# Patient Record
Sex: Male | Born: 1945 | Race: White | Hispanic: No | Smoking: Former smoker
Health system: Southern US, Community
[De-identification: ages and names within clinical notes are randomized; demographics above are authoritative.]

## PROBLEM LIST (undated history)

## (undated) DIAGNOSIS — R350 Frequency of micturition: Secondary | ICD-10-CM

## (undated) DIAGNOSIS — C679 Malignant neoplasm of bladder, unspecified: Secondary | ICD-10-CM

## (undated) DIAGNOSIS — R3915 Urgency of urination: Secondary | ICD-10-CM

## (undated) DIAGNOSIS — E785 Hyperlipidemia, unspecified: Secondary | ICD-10-CM

## (undated) DIAGNOSIS — K219 Gastro-esophageal reflux disease without esophagitis: Secondary | ICD-10-CM

## (undated) DIAGNOSIS — E119 Type 2 diabetes mellitus without complications: Secondary | ICD-10-CM

## (undated) DIAGNOSIS — R351 Nocturia: Secondary | ICD-10-CM

## (undated) DIAGNOSIS — R918 Other nonspecific abnormal finding of lung field: Secondary | ICD-10-CM

## (undated) DIAGNOSIS — I10 Essential (primary) hypertension: Secondary | ICD-10-CM

## (undated) HISTORY — PX: ILEO CONDUIT: SHX1778

## (undated) HISTORY — PX: TONSILLECTOMY: SUR1361

---

## 2004-11-26 ENCOUNTER — Ambulatory Visit: Payer: Self-pay | Admitting: Gastroenterology

## 2007-02-06 ENCOUNTER — Ambulatory Visit: Payer: Self-pay | Admitting: Internal Medicine

## 2010-12-01 ENCOUNTER — Emergency Department: Payer: Self-pay | Admitting: Emergency Medicine

## 2010-12-03 ENCOUNTER — Ambulatory Visit: Payer: Self-pay | Admitting: Internal Medicine

## 2010-12-05 LAB — PSA

## 2010-12-07 ENCOUNTER — Ambulatory Visit: Payer: Self-pay | Admitting: Internal Medicine

## 2010-12-19 ENCOUNTER — Ambulatory Visit: Payer: Self-pay | Admitting: Internal Medicine

## 2012-01-12 ENCOUNTER — Ambulatory Visit: Payer: Self-pay | Admitting: Internal Medicine

## 2012-04-10 ENCOUNTER — Ambulatory Visit: Payer: Self-pay | Admitting: Internal Medicine

## 2012-04-18 ENCOUNTER — Ambulatory Visit: Payer: Self-pay | Admitting: Internal Medicine

## 2012-05-19 ENCOUNTER — Ambulatory Visit: Payer: Self-pay | Admitting: Internal Medicine

## 2012-10-16 ENCOUNTER — Other Ambulatory Visit: Payer: Self-pay | Admitting: Urology

## 2012-10-22 ENCOUNTER — Encounter (HOSPITAL_BASED_OUTPATIENT_CLINIC_OR_DEPARTMENT_OTHER): Payer: Self-pay | Admitting: *Deleted

## 2012-10-22 NOTE — Progress Notes (Signed)
NPO AFTER MN. ARRIVES AT 0830. NEEDS ISTAT AND EKG (ORDERED PER MDA) AND CXR ORDERED BY DR [facility]. WILL TAKE PRILOSEC , NORVASC, PRAVASTATIN, AND METOPROLOL AM OF SURG W/ SIP OF WATER.

## 2012-10-24 ENCOUNTER — Encounter (HOSPITAL_BASED_OUTPATIENT_CLINIC_OR_DEPARTMENT_OTHER): Payer: Self-pay

## 2012-10-24 ENCOUNTER — Observation Stay (HOSPITAL_BASED_OUTPATIENT_CLINIC_OR_DEPARTMENT_OTHER)
Admission: RE | Admit: 2012-10-24 | Discharge: 2012-10-25 | Disposition: A | Discharge status: Home / Self Care | Payer: 59 | Source: Ambulatory Visit | Attending: Urology | Admitting: Urology

## 2012-10-24 ENCOUNTER — Ambulatory Visit (HOSPITAL_BASED_OUTPATIENT_CLINIC_OR_DEPARTMENT_OTHER): Payer: 59 | Admitting: Anesthesiology

## 2012-10-24 ENCOUNTER — Ambulatory Visit (HOSPITAL_COMMUNITY): Payer: 59

## 2012-10-24 ENCOUNTER — Encounter (HOSPITAL_BASED_OUTPATIENT_CLINIC_OR_DEPARTMENT_OTHER): Payer: Self-pay | Admitting: Anesthesiology

## 2012-10-24 ENCOUNTER — Encounter (HOSPITAL_COMMUNITY)
Admission: RE | Disposition: A | Discharge status: Home / Self Care | Payer: Self-pay | Source: Ambulatory Visit | Attending: Urology

## 2012-10-24 ENCOUNTER — Inpatient Hospital Stay (HOSPITAL_COMMUNITY): Payer: 59

## 2012-10-24 DIAGNOSIS — K219 Gastro-esophageal reflux disease without esophagitis: Secondary | ICD-10-CM | POA: Insufficient documentation

## 2012-10-24 DIAGNOSIS — E119 Type 2 diabetes mellitus without complications: Secondary | ICD-10-CM | POA: Insufficient documentation

## 2012-10-24 DIAGNOSIS — I10 Essential (primary) hypertension: Secondary | ICD-10-CM | POA: Insufficient documentation

## 2012-10-24 DIAGNOSIS — R918 Other nonspecific abnormal finding of lung field: Secondary | ICD-10-CM | POA: Insufficient documentation

## 2012-10-24 DIAGNOSIS — F17200 Nicotine dependence, unspecified, uncomplicated: Secondary | ICD-10-CM | POA: Insufficient documentation

## 2012-10-24 DIAGNOSIS — E785 Hyperlipidemia, unspecified: Secondary | ICD-10-CM | POA: Insufficient documentation

## 2012-10-24 DIAGNOSIS — C674 Malignant neoplasm of posterior wall of bladder: Principal | ICD-10-CM | POA: Insufficient documentation

## 2012-10-24 HISTORY — DX: Frequency of micturition: R35.0

## 2012-10-24 HISTORY — DX: Nocturia: R35.1

## 2012-10-24 HISTORY — PX: CYSTOSCOPY WITH URETEROSCOPY: SHX5123

## 2012-10-24 HISTORY — PX: TRANSURETHRAL RESECTION OF BLADDER TUMOR: SHX2575

## 2012-10-24 HISTORY — DX: Malignant neoplasm of bladder, unspecified: C67.9

## 2012-10-24 HISTORY — DX: Urgency of urination: R39.15

## 2012-10-24 HISTORY — DX: Gastro-esophageal reflux disease without esophagitis: K21.9

## 2012-10-24 HISTORY — DX: Essential (primary) hypertension: I10

## 2012-10-24 HISTORY — DX: Hyperlipidemia, unspecified: E78.5

## 2012-10-24 HISTORY — DX: Type 2 diabetes mellitus without complications: E11.9

## 2012-10-24 LAB — CBC
MCHC: 34.8 g/dL (ref 30.0–36.0)
Platelets: 131 10*3/uL — ABNORMAL LOW (ref 150–400)
RDW: 12.7 % (ref 11.5–15.5)
WBC: 5.2 10*3/uL (ref 4.0–10.5)

## 2012-10-24 LAB — GLUCOSE, CAPILLARY
Glucose-Capillary: 186 mg/dL — ABNORMAL HIGH (ref 70–99)
Glucose-Capillary: 333 mg/dL — ABNORMAL HIGH (ref 70–99)

## 2012-10-24 LAB — BASIC METABOLIC PANEL
Chloride: 99 mEq/L (ref 96–112)
GFR calc Af Amer: >90 mL/min (ref >90)
GFR calc non Af Amer: >90 mL/min (ref >90)
Potassium: 4.6 mEq/L (ref 3.5–5.1)
Sodium: 138 mEq/L (ref 135–145)

## 2012-10-24 LAB — POCT I-STAT 4, (NA,K, GLUC, HGB,HCT)
HCT: 45 % (ref 39.0–52.0)
Hemoglobin: 15.3 g/dL (ref 13.0–17.0)
Sodium: 142 mEq/L (ref 135–145)

## 2012-10-24 SURGERY — TURBT (TRANSURETHRAL RESECTION OF BLADDER TUMOR)
Anesthesia: General | Site: Ureter | Wound class: Clean Contaminated

## 2012-10-24 MED ORDER — SUCCINYLCHOLINE CHLORIDE 20 MG/ML IJ SOLN
INTRAMUSCULAR | Status: DC | PRN
  Administered 2012-10-24: 80 mg via INTRAVENOUS
  Administered 2012-10-24: 40 mg via INTRAVENOUS
  Administered 2012-10-24: 80 mg via INTRAVENOUS

## 2012-10-24 MED ORDER — LIDOCAINE HCL (CARDIAC) 20 MG/ML IV SOLN
INTRAVENOUS | Status: DC | PRN
  Administered 2012-10-24: 100 mg via INTRAVENOUS

## 2012-10-24 MED ORDER — HYDROCHLOROTHIAZIDE 12.5 MG PO CAPS
12.5000 mg | ORAL_CAPSULE | Freq: Every day | ORAL | Status: DC
  Administered 2012-10-24 – 2012-10-25 (×2): 12.5 mg via ORAL
  Filled 2012-10-24 (×2): qty 1

## 2012-10-24 MED ORDER — LACTATED RINGERS IV SOLN
INTRAVENOUS | Status: DC
  Filled 2012-10-24: qty 1000

## 2012-10-24 MED ORDER — TAMSULOSIN HCL 0.4 MG PO CAPS
0.4000 mg | ORAL_CAPSULE | Freq: Every day | ORAL | Status: DC
  Administered 2012-10-24 – 2012-10-25 (×2): 0.4 mg via ORAL
  Filled 2012-10-24 (×3): qty 1

## 2012-10-24 MED ORDER — AMLODIPINE BESYLATE 5 MG PO TABS
5.0000 mg | ORAL_TABLET | Freq: Every morning | ORAL | Status: DC
  Filled 2012-10-24 (×2): qty 1

## 2012-10-24 MED ORDER — MIDAZOLAM HCL 5 MG/5ML IJ SOLN
INTRAMUSCULAR | Status: DC | PRN
  Administered 2012-10-24: 2 mg via INTRAVENOUS

## 2012-10-24 MED ORDER — METOPROLOL SUCCINATE ER 100 MG PO TB24
200.0000 mg | ORAL_TABLET | Freq: Every morning | ORAL | Status: DC
  Administered 2012-10-25: 200 mg via ORAL
  Filled 2012-10-24: qty 2

## 2012-10-24 MED ORDER — ONDANSETRON HCL 4 MG/2ML IJ SOLN
INTRAMUSCULAR | Status: DC | PRN
  Administered 2012-10-24: 4 mg via INTRAVENOUS

## 2012-10-24 MED ORDER — ONDANSETRON HCL 4 MG/2ML IJ SOLN
4.0000 mg | INTRAMUSCULAR | Status: DC | PRN
  Filled 2012-10-24: qty 2

## 2012-10-24 MED ORDER — PANTOPRAZOLE SODIUM 40 MG PO TBEC
40.0000 mg | DELAYED_RELEASE_TABLET | Freq: Every day | ORAL | Status: DC
Start: 2012-10-24 — End: 2012-10-25
  Administered 2012-10-24 – 2012-10-25 (×2): 40 mg via ORAL
  Filled 2012-10-24 (×3): qty 1

## 2012-10-24 MED ORDER — SODIUM CHLORIDE 0.9 % IR SOLN
3000.0000 mL | Status: DC
  Administered 2012-10-24 (×2): 3000 mL

## 2012-10-24 MED ORDER — GLIMEPIRIDE 1 MG PO TABS
1.0000 mg | ORAL_TABLET | Freq: Two times a day (BID) | ORAL | Status: DC
  Filled 2012-10-24 (×3): qty 1

## 2012-10-24 MED ORDER — OXYBUTYNIN CHLORIDE 5 MG PO TABS
5.0000 mg | ORAL_TABLET | Freq: Three times a day (TID) | ORAL | Status: DC | PRN
  Administered 2012-10-25: 5 mg via ORAL
  Filled 2012-10-24: qty 1

## 2012-10-24 MED ORDER — LACTATED RINGERS IV SOLN
INTRAVENOUS | Status: DC
  Administered 2012-10-24: 18:00:00 via INTRAVENOUS

## 2012-10-24 MED ORDER — LISINOPRIL-HYDROCHLOROTHIAZIDE 20-12.5 MG PO TABS: 1.0000 | ORAL_TABLET | Freq: Every morning | ORAL | Status: DC

## 2012-10-24 MED ORDER — DEXTROSE 5 % IV SOLN
500.0000 mg | INTRAVENOUS | Status: DC
  Administered 2012-10-24 – 2012-10-25 (×2): 500 mg via INTRAVENOUS
  Filled 2012-10-24 (×3): qty 12.5

## 2012-10-24 MED ORDER — LACTATED RINGERS IV SOLN
INTRAVENOUS | Status: DC
  Administered 2012-10-24 (×2): via INTRAVENOUS
  Filled 2012-10-24: qty 1000

## 2012-10-24 MED ORDER — GLIMEPIRIDE 1 MG PO TABS
1.0000 mg | ORAL_TABLET | Freq: Two times a day (BID) | ORAL | Status: DC
  Administered 2012-10-24 – 2012-10-25 (×2): 1 mg via ORAL
  Filled 2012-10-24 (×4): qty 1

## 2012-10-24 MED ORDER — METOPROLOL SUCCINATE ER 100 MG PO TB24
200.0000 mg | ORAL_TABLET | Freq: Every morning | ORAL | Status: DC
  Filled 2012-10-24 (×2): qty 2

## 2012-10-24 MED ORDER — INSULIN ASPART 100 UNIT/ML ~~LOC~~ SOLN
0.0000 [IU] | Freq: Three times a day (TID) | SUBCUTANEOUS | Status: DC
  Administered 2012-10-24: 11 [IU] via SUBCUTANEOUS
  Administered 2012-10-25 (×2): 8 [IU] via SUBCUTANEOUS

## 2012-10-24 MED ORDER — LISINOPRIL 20 MG PO TABS
20.0000 mg | ORAL_TABLET | Freq: Every day | ORAL | Status: DC
  Administered 2012-10-24 – 2012-10-25 (×2): 20 mg via ORAL
  Filled 2012-10-24 (×2): qty 1

## 2012-10-24 MED ORDER — HYDROMORPHONE HCL PF 1 MG/ML IJ SOLN
0.5000 mg | INTRAMUSCULAR | Status: DC | PRN
  Filled 2012-10-24: qty 1

## 2012-10-24 MED ORDER — GENTAMICIN IN SALINE 1.6-0.9 MG/ML-% IV SOLN
80.0000 mg | INTRAVENOUS | Status: DC
  Filled 2012-10-24: qty 50

## 2012-10-24 MED ORDER — INSULIN ASPART 100 UNIT/ML ~~LOC~~ SOLN: 0.0000 [IU] | Freq: Three times a day (TID) | SUBCUTANEOUS | Status: DC

## 2012-10-24 MED ORDER — ASPIRIN EC 325 MG PO TBEC
325.0000 mg | DELAYED_RELEASE_TABLET | Freq: Every morning | ORAL | Status: DC
  Administered 2012-10-24: 325 mg via ORAL
  Filled 2012-10-24 (×3): qty 1

## 2012-10-24 MED ORDER — FENTANYL CITRATE 0.05 MG/ML IJ SOLN
INTRAMUSCULAR | Status: DC | PRN
  Administered 2012-10-24: 75 ug via INTRAVENOUS
  Administered 2012-10-24: 50 ug via INTRAVENOUS
  Administered 2012-10-24: 100 ug via INTRAVENOUS
  Administered 2012-10-24 (×2): 50 ug via INTRAVENOUS
  Administered 2012-10-24: 25 ug via INTRAVENOUS
  Administered 2012-10-24: 50 ug via INTRAVENOUS

## 2012-10-24 MED ORDER — FENTANYL CITRATE 0.05 MG/ML IJ SOLN
25.0000 ug | INTRAMUSCULAR | Status: DC | PRN
  Filled 2012-10-24: qty 1

## 2012-10-24 MED ORDER — KETOROLAC TROMETHAMINE 30 MG/ML IJ SOLN
INTRAMUSCULAR | Status: DC | PRN
  Administered 2012-10-24: 15 mg via INTRAVENOUS

## 2012-10-24 MED ORDER — OXYCODONE-ACETAMINOPHEN 5-325 MG PO TABS
1.0000 | ORAL_TABLET | ORAL | Status: DC | PRN
  Administered 2012-10-24 – 2012-10-25 (×3): 1 via ORAL
  Filled 2012-10-24 (×3): qty 1
  Filled 2012-10-24: qty 2

## 2012-10-24 MED ORDER — PROPOFOL 10 MG/ML IV BOLUS
INTRAVENOUS | Status: DC | PRN
  Administered 2012-10-24: 300 mg via INTRAVENOUS

## 2012-10-24 MED ORDER — IOHEXOL 300 MG/ML  SOLN
100.0000 mL | Freq: Once | INTRAMUSCULAR | Status: AC | PRN
  Administered 2012-10-24: 100 mL via INTRAVENOUS

## 2012-10-24 MED ORDER — AMLODIPINE BESYLATE 5 MG PO TABS
5.0000 mg | ORAL_TABLET | Freq: Every day | ORAL | Status: DC
  Administered 2012-10-25: 5 mg via ORAL
  Filled 2012-10-24: qty 1

## 2012-10-24 MED ORDER — SENNA 8.6 MG PO TABS
1.0000 | ORAL_TABLET | Freq: Two times a day (BID) | ORAL | Status: DC
  Administered 2012-10-24 – 2012-10-25 (×3): 8.6 mg via ORAL
  Filled 2012-10-24 (×4): qty 1

## 2012-10-24 MED ORDER — DOCUSATE SODIUM 100 MG PO CAPS
100.0000 mg | ORAL_CAPSULE | Freq: Two times a day (BID) | ORAL | Status: DC
  Administered 2012-10-24 – 2012-10-25 (×3): 100 mg via ORAL
  Filled 2012-10-24 (×5): qty 1

## 2012-10-24 MED ORDER — SIMVASTATIN 20 MG PO TABS
20.0000 mg | ORAL_TABLET | Freq: Every day | ORAL | Status: DC
Start: 2012-10-24 — End: 2012-10-25
  Administered 2012-10-24: 20 mg via ORAL
  Filled 2012-10-24 (×3): qty 1

## 2012-10-24 MED ORDER — DEXAMETHASONE SODIUM PHOSPHATE 4 MG/ML IJ SOLN
INTRAMUSCULAR | Status: DC | PRN
  Administered 2012-10-24: 4 mg via INTRAVENOUS

## 2012-10-24 SURGICAL SUPPLY — 35 items
BAG URINE DRAINAGE (UROLOGICAL SUPPLIES) ×4 IMPLANT
BAG URINE LEG 19OZ MD ST LTX (BAG) IMPLANT
BAG URINE LEG 500ML (DRAIN) ×4 IMPLANT
BAG URO CATCHER STRL LF (DRAPE) ×4 IMPLANT
BASKET LASER NITINOL 1.9FR (BASKET) IMPLANT
BASKET ZERO TIP NITINOL 2.4FR (BASKET) IMPLANT
CATH FOLEY 2WAY  3CC  8FR (CATHETERS)
CATH FOLEY 2WAY 3CC 8FR (CATHETERS) IMPLANT
CATH FOLEY 2WAY SLVR  5CC 22FR (CATHETERS)
CATH FOLEY 2WAY SLVR 30CC 20FR (CATHETERS) IMPLANT
CATH FOLEY 2WAY SLVR 30CC 24FR (CATHETERS) ×4 IMPLANT
CATH FOLEY 2WAY SLVR 5CC 22FR (CATHETERS) IMPLANT
CATH INTERMIT  6FR 70CM (CATHETERS) IMPLANT
CLOTH BEACON ORANGE TIMEOUT ST (SAFETY) ×4 IMPLANT
DRAPE CAMERA CLOSED 9X96 (DRAPES) ×4 IMPLANT
ELECT LOOP MED HF 24F 12D CBL (CLIP) ×4 IMPLANT
ELECT REM PT RETURN 9FT ADLT (ELECTROSURGICAL) ×4
ELECTRODE REM PT RTRN 9FT ADLT (ELECTROSURGICAL) ×3 IMPLANT
EVACUATOR MICROVAS BLADDER (UROLOGICAL SUPPLIES) IMPLANT
GLOVE BIO SURGEON STRL SZ7 (GLOVE) ×4 IMPLANT
GLOVE BIO SURGEON STRL SZ7.5 (GLOVE) ×4 IMPLANT
GLOVE INDICATOR 6.5 STRL GRN (GLOVE) ×8 IMPLANT
GOWN PREVENTION PLUS XLARGE (GOWN DISPOSABLE) IMPLANT
GOWN STRL NON-REIN LRG LVL3 (GOWN DISPOSABLE) ×8 IMPLANT
GUIDEWIRE ANG ZIPWIRE 038X150 (WIRE) ×4 IMPLANT
GUIDEWIRE STR DUAL SENSOR (WIRE) ×4 IMPLANT
IV NS IRRIG 3000ML ARTHROMATIC (IV SOLUTION) ×24 IMPLANT
KIT ASPIRATION TUBING (SET/KITS/TRAYS/PACK) IMPLANT
LOOP CUTTING 24FR OLYMPUS (CUTTING LOOP) IMPLANT
LOOP CUTTING 26FR OLYMPUS (CUTTING LOOP) IMPLANT
PACK CYSTOSCOPY (CUSTOM PROCEDURE TRAY) ×4 IMPLANT
SET ASPIRATION TUBING (TUBING) IMPLANT
SYRINGE 10CC LL (SYRINGE) ×4 IMPLANT
SYRINGE IRR TOOMEY STRL 70CC (SYRINGE) ×4 IMPLANT
TUBE FEEDING 8FR 16IN STR KANG (MISCELLANEOUS) IMPLANT

## 2012-10-24 NOTE — Progress Notes (Signed)
Dr. Berneice Heinrich in and taked w/ pt.

## 2012-10-24 NOTE — Anesthesia Postprocedure Evaluation (Signed)
  Anesthesia Post-op Note  Patient: Mario Reese  Procedure(s) Performed: Procedure(s) (LRB): TRANSURETHRAL RESECTION OF BLADDER TUMOR (TURBT) (N/A) CYSTOSCOPY WITH RETROGRADE PYELOGRAM/URETERAL STENT PLACEMENT (Bilateral)  Patient Location: PACU  Anesthesia Type: General  Level of Consciousness: awake and alert   Airway and Oxygen Therapy: Patient Spontanous Breathing  Post-op Pain: mild  Post-op Assessment: Post-op Vital signs reviewed, Patient's Cardiovascular Status Stable, Respiratory Function Stable, Patent Airway and No signs of Nausea or vomiting  Post-op Vital Signs: stable  Complications: No apparent anesthesia complications

## 2012-10-24 NOTE — Progress Notes (Signed)
Patient had an HS blood sugar of 344, no HS coverage ordered. MD notified. Will continue to monitor. Leeroy Cha

## 2012-10-24 NOTE — Anesthesia Procedure Notes (Signed)
Procedure Name: LMA Insertion Date/Time: 10/24/2012 10:05 AM Performed by: Norva Pavlov Pre-anesthesia Checklist: Patient identified, Emergency Drugs available, Suction available and Patient being monitored Patient Re-evaluated:Patient Re-evaluated prior to inductionOxygen Delivery Method: Circle System Utilized Preoxygenation: Pre-oxygenation with 100% oxygen Intubation Type: IV induction Ventilation: Mask ventilation without difficulty LMA: LMA inserted LMA Size: 5.0 Number of attempts: 1 Airway Equipment and Method: bite block Placement Confirmation: positive ETCO2 Tube secured with: Tape Dental Injury: Teeth and Oropharynx as per pre-operative assessment

## 2012-10-24 NOTE — H&P (Signed)
Mario Reese is an 66 y.o. male.   Chief Complaint: Pre-Op Transurethral Resetion of BLadder Tumor HPI:  1 - Large Bladder Neoplasm - Pt with very large multifocal bladder mass found 09/2012 on work-up of gross hematuria. 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 Also several small pulmonary nodules on staging CT that are somewhat worrisome for metastatic disease, but pt states these have been present for some time and stable by serial imaging.  PMH sig for DM2, Obesity. No CV disease. No strong blood thinners.  Past Medical History  Diagnosis Date  . Hypertension   . Hyperlipidemia   . Diabetes mellitus, type 2   . GERD (gastroesophageal reflux disease)   . Frequency of urination   . Urgency of urination   . Nocturia   . Bladder cancer   . Hematuria     History reviewed. No pertinent past surgical history.  History reviewed. No pertinent family history. Social History:  reports that he quit smoking about a year ago. His smoking use included Cigarettes. He has a 30 pack-year smoking history. He has never used smokeless tobacco. He reports that he drinks about 21.6 ounces of alcohol per week. He reports that he does not use illicit drugs.  Allergies: No Known Allergies  No prescriptions prior to admission    No results found for this or any previous visit (from the past 48 hour(s)). No results found.  Review of Systems  Constitutional: Negative.  Negative for fever and chills.  HENT: Negative.   Eyes: Negative.   Respiratory: Negative.   Cardiovascular: Negative.  Negative for chest pain.  Gastrointestinal: Negative.   Genitourinary: Positive for frequency and hematuria.  Musculoskeletal: Negative.   Skin: Negative.   Neurological: Negative.   Endo/Heme/Allergies: Negative.   Psychiatric/Behavioral: Negative.     Height 6\' 2"  (1.88 m), weight 87.544 kg (193 lb). Physical Exam  Constitutional: He is oriented to person, place,  and time. He appears well-developed and well-nourished.  HENT:  Head: Normocephalic and atraumatic.  Eyes: EOM are normal. Pupils are equal, round, and reactive to light.  Neck: Normal range of motion. Neck supple.  Cardiovascular: Normal rate and regular rhythm.   Respiratory: Effort normal and breath sounds normal.  GI: Soft. Bowel sounds are normal.       obese  Genitourinary: Penis normal.  Musculoskeletal: Normal range of motion.  Neurological: He is alert and oriented to person, place, and time.  Skin: Skin is warm and dry.  Psychiatric: He has a normal mood and affect. His behavior is normal. Judgment and thought content normal.     Assessment/Plan 1 - Large Bladder Neoplasm - Proceed with TURBT with bilateral retrogrades, possible bilateral stents (high probability of tumor near ureteral orifices's). I again explained that the procedure today was mostly diagnostic in nature but would hopefully have therapeutic benefit as well, especially if low-grade tumor. He will clearly need more future therapy, likely surgical.  Risks including bleeding, infection, damage to kidney / ureter / bladder as well as rare risks such as MI, CVA, PE, DVT, mortality once again reiterated.   Dewane Timson 10/24/2012, 7:20 AM

## 2012-10-24 NOTE — Brief Op Note (Signed)
10/24/2012  11:36 AM  PATIENT:  Mario Reese  66 y.o. male  PRE-OPERATIVE DIAGNOSIS:  Bladder cancer  POST-OPERATIVE DIAGNOSIS:  * No post-op diagnosis entered *  PROCEDURE:  Procedure(s) (LRB) with comments: TRANSURETHRAL RESECTION OF BLADDER TUMOR (TURBT) (N/A) - 2 hrs needs overnight. RAD TECH OK PER CATHY  CYSTOSCOPY WITH RETROGRADE PYELOGRAM/URETERAL STENT PLACEMENT (Bilateral)  SURGEON:  Surgeon(s) and Role:    * Sebastian Ache, MD - Primary  PHYSICIAN ASSISTANT:   ASSISTANTS: none   ANESTHESIA:   general  EBL:  Total I/O In: 1000 [I.V.:1000] Out: -   BLOOD ADMINISTERED:none  DRAINS: 24 F 3 way foley to NS bladder irrigation   LOCAL MEDICATIONS USED:  NONE  SPECIMEN:  Source of Specimen:  1 - Bladder TUmor, 2 - Base of posterior bladder tumor, 3 - Base of superior bladder tumor, 4- Base of Rt bladder tumor  DISPOSITION OF SPECIMEN:  PATHOLOGY  COUNTS:  YES  TOURNIQUET:  * No tourniquets in log *  DICTATION: .Other Dictation: Dictation Number [id number]  PLAN OF CARE: Admit for overnight observation  PATIENT DISPOSITION:  PACU - hemodynamically stable.   Delay start of Pharmacological VTE agent (>24hrs) due to surgical blood loss or risk of bleeding: no

## 2012-10-24 NOTE — Transfer of Care (Signed)
Immediate Anesthesia Transfer of Care Note  Patient: Mario Reese  Procedure(s) Performed: Procedure(s) (LRB): TRANSURETHRAL RESECTION OF BLADDER TUMOR (TURBT) (N/A) CYSTOSCOPY WITH RETROGRADE PYELOGRAM/URETERAL STENT PLACEMENT (Bilateral)  Patient Location: PACU  Anesthesia Type: General  Level of Consciousness: awake, alert  and oriented  Airway & Oxygen Therapy: Patient Spontanous Breathing and Patient connected to face mask oxygen  Post-op Assessment: Report given to PACU RN and Post -op Vital signs reviewed and stable  Post vital signs: Reviewed and stable  Complications: No apparent anesthesia complications

## 2012-10-24 NOTE — Anesthesia Preprocedure Evaluation (Addendum)
Anesthesia Evaluation  Patient identified by MRN, date of birth, ID band Patient awake    Reviewed: Allergy & Precautions, H&P , NPO status , Patient's Chart, lab work & pertinent test results, reviewed documented beta blocker date and time   Airway Mallampati: III TM Distance: >3 FB Neck ROM: full    Dental  (+) Missing and Dental Advisory Given,    Pulmonary  Multiple places on CXR that look like mets, but they got worked up and are not mets. breath sounds clear to auscultation  Pulmonary exam normal       Cardiovascular Exercise Tolerance: Good hypertension, Pt. on home beta blockers and Pt. on medications Rhythm:regular Rate:Normal     Neuro/Psych negative neurological ROS  negative psych ROS   GI/Hepatic negative GI ROS, Neg liver ROS, GERD-  Medicated and Controlled,  Endo/Other  diabetes, Well Controlled, Type 2, Oral Hypoglycemic AgentsMorbid obesity  Renal/GU negative Renal ROS  negative genitourinary   Musculoskeletal   Abdominal   Peds  Hematology negative hematology ROS (+)   Anesthesia Other Findings   Reproductive/Obstetrics negative OB ROS                          Anesthesia Physical Anesthesia Plan  ASA: III  Anesthesia Plan: General   Post-op Pain Management:    Induction: Intravenous  Airway Management Planned: LMA  Additional Equipment:   Intra-op Plan:   Post-operative Plan:   Informed Consent: I have reviewed the patients History and Physical, chart, labs and discussed the procedure including the risks, benefits and alternatives for the proposed anesthesia with the patient or authorized representative who has indicated his/her understanding and acceptance.   Dental Advisory Given  Plan Discussed with: CRNA and Surgeon  Anesthesia Plan Comments:        Anesthesia Quick Evaluation

## 2012-10-25 ENCOUNTER — Inpatient Hospital Stay (HOSPITAL_COMMUNITY): Payer: 59

## 2012-10-25 ENCOUNTER — Other Ambulatory Visit (HOSPITAL_COMMUNITY): Payer: Self-pay | Admitting: Urology

## 2012-10-25 DIAGNOSIS — C679 Malignant neoplasm of bladder, unspecified: Secondary | ICD-10-CM

## 2012-10-25 DIAGNOSIS — R918 Other nonspecific abnormal finding of lung field: Secondary | ICD-10-CM

## 2012-10-25 LAB — GLUCOSE, CAPILLARY: Glucose-Capillary: 344 mg/dL — ABNORMAL HIGH (ref 70–99)

## 2012-10-25 MED ORDER — PNEUMOCOCCAL VAC POLYVALENT 25 MCG/0.5ML IJ INJ
0.5000 mL | INJECTION | Freq: Once | INTRAMUSCULAR | Status: AC
  Administered 2012-10-25: 0.5 mL via INTRAMUSCULAR
  Filled 2012-10-25: qty 0.5

## 2012-10-25 MED ORDER — SENNA-DOCUSATE SODIUM 8.6-50 MG PO TABS: 1.0000 | ORAL_TABLET | Freq: Two times a day (BID) | ORAL | Status: DC

## 2012-10-25 MED ORDER — OXYCODONE-ACETAMINOPHEN 5-325 MG PO TABS
1.0000 | ORAL_TABLET | ORAL | Status: DC | PRN
Start: 2012-10-25 — End: 2013-04-22

## 2012-10-25 MED ORDER — INFLUENZA VIRUS VACC SPLIT PF IM SUSP: 0.5000 mL | INTRAMUSCULAR | Status: DC

## 2012-10-25 MED ORDER — OXYBUTYNIN CHLORIDE 5 MG PO TABS: 5.0000 mg | ORAL_TABLET | Freq: Three times a day (TID) | ORAL | Status: DC | PRN

## 2012-10-25 MED ORDER — PNEUMOCOCCAL VAC POLYVALENT 25 MCG/0.5ML IJ INJ: 0.5000 mL | INJECTION | INTRAMUSCULAR | Status: DC

## 2012-10-25 MED ORDER — INFLUENZA VIRUS VACC SPLIT PF IM SUSP
0.5000 mL | Freq: Once | INTRAMUSCULAR | Status: AC
  Administered 2012-10-25: 0.5 mL via INTRAMUSCULAR
  Filled 2012-10-25: qty 0.5

## 2012-10-25 NOTE — Progress Notes (Signed)
1 Day Post-Op  Subjective: 1 - Bladder Cancer - POD1 s/p large volume TURBT. CT yesterday confirms large and multifocal lung nodules worrisome for possible metastatic disease v. Known prior granulomatous disease.  No events overnight.   IR consulted for possible CT-guided lung BX today. coags pending, NPO for now.   Objective: Vital signs in last 24 hours: Temp:  [97 F (36.1 C)-98.4 F (36.9 C)] 98.1 F (36.7 C) (11/07 0511) Pulse Rate:  [66-85] 77  (11/07 0511) Resp:  [15-19] 18  (11/07 0511) BP: (122-161)/(57-98) 123/71 mmHg (11/07 0511) SpO2:  [90 %-98 %] 94 % (11/07 0511) Weight:  [134 kg (295 lb 6.7 oz)-134.265 kg (296 lb)] 134 kg (295 lb 6.7 oz) (11/06 1400) Last BM Date: 10/24/12  Intake/Output from previous day: 11/06 0701 - 11/07 0700 In: 9354.2 [P.O.:940; I.V.:2301.7; IV Piggyback:112.5] Out: 8000 [Urine:8000] Intake/Output this shift:    General appearance: alert, cooperative, appears stated age and obese Head: Normocephalic, without obvious abnormality, atraumatic Eyes: conjunctivae/corneas clear. PERRL, EOM's intact. Fundi benign. Ears: normal TM's and external ear canals both ears Nose: Nares normal. Septum midline. Mucosa normal. No drainage or sinus tenderness. Throat: lips, mucosa, and tongue normal; teeth and gums normal Neck: no adenopathy, no carotid bruit, no JVD, supple, symmetrical, trachea midline and thyroid not enlarged, symmetric, no tenderness/mass/nodules Resp: clear to auscultation bilaterally Chest wall: no tenderness Cardio: regular rate and rhythm, S1, S2 normal, no murmur, click, rub or gallop GI: soft, non-tender; bowel sounds normal; no masses,  no organomegaly Male genitalia: normal, 3 way foley c/d/i with yellow urine. CBI turned off. Extremities: extremities normal, atraumatic, no cyanosis or edema Pulses: 2+ and symmetric Skin: Skin color, texture, turgor normal. No rashes or lesions Lymph nodes: Cervical, supraclavicular, and axillary  nodes normal. Neurologic: Grossly normal  Lab Results:   Basename 10/24/12 1235 10/24/12 0922  WBC 5.2 --  HGB 14.9 15.3  HCT 42.8 45.0  PLT 131* --   BMET  Basename 10/24/12 1235 10/24/12 0922  NA 138 142  K 4.6 4.1  CL 99 --  CO2 28 --  GLUCOSE 255* 245*  BUN 24* --  CREATININE 0.79 --  CALCIUM 9.0 --   PT/INR No results found for this basename: LABPROT:2,INR:2 in the last 72 hours ABG No results found for this basename: PHART:2,PCO2:2,PO2:2,HCO3:2 in the last 72 hours  Studies/Results: Dg Chest 2 View  10/24/2012  *RADIOLOGY REPORT*  Clinical Data: Preoperative radiograph.  Bladder surgery today.  CHEST - 2 VIEW  Comparison: CT 10/11/2012.  Findings: Bilateral pulmonary nodules are present compatible with pulmonary metastases in this patient with a primary malignancy.  35 mm mass in the right midlung.  Nodules are present bilaterally. There is an elongated mass either in the inferior right upper lobe or superior right middle lobe measuring 52 mm long axis.  There is no gross plain film evidence of mediastinal adenopathy.  Thoracic spondylosis is present.  IMPRESSION:  1.  Pulmonary metastatic disease.  Multiple bilateral pulmonary nodules and masses with the largest measuring 52 mm in the anterior right lung. 2.  No pneumonia or acute cardiopulmonary disease.  These results will be called to the ordering clinician or representative by the Radiologist Assistant, and communication documented in the PACS Dashboard.   Original Report Authenticated By: Andreas Newport, M.D.    Ct Chest W Contrast  10/24/2012  *RADIOLOGY REPORT*  Clinical Data: History of bladder cancer.  Multiple lung nodules.  CT CHEST WITH CONTRAST  Technique:  Multidetector CT  imaging of the chest was performed following the standard protocol during bolus administration of intravenous contrast.  Contrast: OMNIPAQUE IOHEXOL 300 MG/ML  SOLN  Comparison: Chest x-ray 10/24/2012.  CT of the abdomen and pelvis  10/11/2012.  Findings:  Mediastinum: Heart size is normal. There is no significant pericardial fluid, thickening or pericardial calcification. There is atherosclerosis of the thoracic aorta, the great vessels of the mediastinum and the coronary arteries, including calcified atherosclerotic plaque in the left anterior descending and left circumflex coronary arteries. Borderline enlarged 1 cm short-axis subcarinal lymph nodes.  No other definite pathologic mediastinal or hilar lymph node enlargement.  Esophagus is unremarkable in appearance.  Lungs/Pleura: There are numerous pulmonary nodules and masses scattered throughout all aspects of the lungs bilaterally.  The largest single mass is in the right middle lobe abutting the minor fissure (image 32 of series 5) measuring 4.6 x 4.4 cm.  The largest nodule in the left lung is in the left lower lobe (image 42 of series 5) measuring 2.6 x 2.2 cm. These nodules appears similar to one another, generally having macrolobulated margins with some heterogeneous internal calcification and several with some slight peripheral spiculation.  No acute consolidative airspace disease. No definite pleural effusions.  Upper Abdomen: Diffusely decreased attenuation throughout the hepatic parenchyma, compatible with severe hepatic steatosis.  Musculoskeletal: There are no aggressive appearing lytic or blastic lesions noted in the visualized portions of the skeleton.  IMPRESSION: 1.  Numerous pulmonary nodules and masses scattered throughout the lungs bilaterally.  The majority of these lesions have some amount of internal calcification.  Differential considerations include both malignant and benign etiologies. Given the presence of calcium within these nodules, the possibility of granulomatous disease is not excluded, however, these nodules are very unusual in appearance and granulomatous disease is not favored.  Metastatic disease is more strongly favored.  Clinical correlation is  recommended, with consideration for further evaluation with PET CT or biopsy if indicated. Alternatively, review of the medical record indicates that the patient claims that he has had pulmonary nodules for a number of years and that they have been stable.  If this is the case, correlation with any prior outside CT scans would be of use to assess for the stability of these lesions. 2. Atherosclerosis, including two-vessel coronary artery disease. Assessment for potential risk factor modification, dietary therapy or pharmacologic therapy may be warranted, if clinically indicated. 3.  Severe hepatic steatosis.   Original Report Authenticated By: Trudie Reed, M.D.    Dg Retrograde Pyelogram  10/24/2012  *RADIOLOGY REPORT*  Clinical Data: Bladder tumor  RETROGRADE PYELOGRAM  Comparison: CT abdomen pelvis - 10/11/2012; chest CT - earlier same day  Findings:  Seven spot fluoroscopic images during intraoperative bilateral retrograde pyelogram are provided for review.  There is selective cannulation of the distal aspect of the right ureter. There are no discrete filling defects in the past and by portions of the right ureter or the right collecting system.  No evidence of hydronephrosis.  There is selective cannulation of the distal aspect of the left ureter.  No discrete filling defects are seen within the opacified portions of the left ureter or left collecting system.  An air bubble is seen within the left renal pelvis, apparently resolved on the subsequent image (images 34 of 35).  No evidence of left-sided hydronephrosis.  IMPRESSION: Bilateral intraoperative retrograde pyelogram as above.  No discrete filling defects to suggest the presence of multifocal TCC.   Original Report Authenticated By: Jonny Ruiz  Judithann Sheen, MD     Anti-infectives: Anti-infectives     Start     Dose/Rate Route Frequency Ordered Stop   10/24/12 0930   gentamicin (GARAMYCIN) 500 mg in dextrose 5 % 100 mL IVPB        500 mg 112.5 mL/hr  over 60 Minutes Intravenous Every 24 hours 10/24/12 0844     10/24/12 0834   gentamicin (GARAMYCIN) IVPB 80 mg  Status:  Discontinued        80 mg 100 mL/hr over 30 Minutes Intravenous 30 min pre-op 10/24/12 0834 10/24/12 1445          Assessment/Plan: 1 - Bladder Cancer - Doing well POD1. - CBI to off, but foley to remain - Hopefully guided lung BX today to r/o lung mets, as this would GREATLY impact future therapy - NPO, coags for procedure, resume diet after BX - Likely DC tomorrow.   LOS: 1 day    [facility], Courtny Bennison 10/25/2012

## 2012-10-25 NOTE — Discharge Summary (Signed)
Physician Discharge Summary  Patient ID: Mario Reese MRN: [id number] DOB/AGE: 67-Dec-1947 67 y.o.  Admit date: 10/24/2012 Discharge date: 10/25/2012  Admission Diagnoses: Bladder CAncer  Discharge Diagnoses: Bladder Cancer Active Problems:  * No active hospital problems. *    Discharged Condition: good  Hospital Course:  Pt underwent TURBT at [facility] on the day of admission withtou acute complications. He was admitted for observation afterwards and further diagnostics related to lung nodules. CT chest 11/6 revealed large nodules likley c/w granulomtous disease v. Metastatic cancer. IR not willing to biopsy lesions and instead prefers PET. Radiology not willing to perform PET while in house despite strong requests. Given this, pt felt adequate for discharge.  Consults: None  Significant Diagnostic Studies: CT chest - pulmonary noduels  Treatments: surgery: TURBT 10/24/2012  Discharge Exam: Blood pressure 142/74, pulse 75, temperature 98 F (36.7 C), temperature source Oral, resp. rate 18, height 6\' 2"  (1.88 m), weight 134 kg (295 lb 6.7 oz), SpO2 92.00%. General appearance: alert, cooperative and appears stated age Head: Normocephalic, without obvious abnormality, atraumatic Eyes: conjunctivae/corneas clear. PERRL, EOM's intact. Fundi benign. Ears: normal TM's and external ear canals both ears Resp: clear to auscultation bilaterally Chest wall: no tenderness Cardio: regular rate and rhythm, S1, S2 normal, no murmur, click, rub or gallop GI: soft, non-tender; bowel sounds normal; no masses,  no organomegaly Male genitalia: normal, abnormal findings: foley c/d/i with dark yellow urine off irrigation. Pulses: 2+ and symmetric Neurologic: Grossly normal Incision/Wound:  Disposition: Final discharge disposition not confirmed     Medication List     As of 10/25/2012 12:31 PM    TAKE these medications         amLODipine 5 MG tablet   Commonly known as: NORVASC     Take 5 mg by mouth every morning.      aspirin EC 325 MG tablet   Take 325 mg by mouth every morning.      Fish Oil 1000 MG Caps   Take 2 capsules by mouth every morning.      glimepiride 1 MG tablet   Commonly known as: AMARYL   Take 1 mg by mouth 2 (two) times daily.      lisinopril-hydrochlorothiazide 20-12.5 MG per tablet   Commonly known as: PRINZIDE,ZESTORETIC   Take 2 tablets by mouth every morning.      metformin 500 MG (OSM) 24 hr tablet   Commonly known as: FORTAMET   Take 2,000 mg by mouth daily with breakfast.      metoprolol succinate 100 MG 24 hr tablet   Commonly known as: TOPROL-XL   Take 200 mg by mouth every morning. Take with or immediately following a meal.      MULTIPLE VITAMIN PO   Take 1 tablet by mouth every morning.      omeprazole 20 MG capsule   Commonly known as: PRILOSEC   Take 20 mg by mouth every morning.      oxybutynin 5 MG tablet   Commonly known as: DITROPAN   Take 1 tablet (5 mg total) by mouth every 8 (eight) hours as needed. For bladder spasms      oxyCODONE-acetaminophen 5-325 MG per tablet   Commonly known as: PERCOCET/ROXICET   Take 1-2 tablets by mouth every 4 (four) hours as needed.      pravastatin 40 MG tablet   Commonly known as: PRAVACHOL   Take 40 mg by mouth every morning.      sennosides-docusate sodium  8.6-50 MG tablet   Commonly known as: SENOKOT-S   Take 1 tablet by mouth 2 (two) times daily. While taking pain meds to prevent constipation           Follow-up Information    Follow up with Sebastian Ache, MD. (Dr. Emmaline Life office will contact you to set up appt next week)    Contact information:   [address] [place] [address] [phone]          Signed: Sebastian Ache 10/25/2012, 12:31 PM

## 2012-10-25 NOTE — Progress Notes (Signed)
Patient ID: Mario Reese, male   DOB: [birth year], 66 y.o.   MRN: [id number] I have reviewed chart and CT. Multiple pulmonary nodules, most are likely old granulomas. Smaller noncalcified lesions may be mets. Recommend PET to differentiate before proceeding with percutaneous biopsy. Disccussed with Dr. Berneice Heinrich.

## 2012-10-25 NOTE — Care Management Note (Signed)
    Page 1 of 1   10/25/2012     1:12:28 PM   CARE MANAGEMENT NOTE 10/25/2012  Patient:  Mario Reese, Mario Reese   Account Number:  [id number]  Date Initiated:  10/25/2012  Documentation initiated by:  Lanier Clam  Subjective/Objective Assessment:   ADMITTED W/L SIDE [id number] PALSY     Action/Plan:   FROM GROUP HOME   Anticipated DC Date:  10/25/2012   Anticipated DC Plan:  GROUP HOME      DC Planning Services  CM consult      Choice offered to / List presented to:             Status of service:  Completed, signed off Medicare Important Message given?   (If response is "NO", the following Medicare IM given date fields will be blank) Date Medicare IM given:   Date Additional Medicare IM given:    Discharge Disposition:  GROUP HOME  Per UR Regulation:  Reviewed for med. necessity/level of care/duration of stay  If discussed at Long Length of Stay Meetings, dates discussed:    Comments:  10/25/12 [facility] Deshawna Mcneece RN,BSN NCM [phone]

## 2012-10-25 NOTE — Op Note (Signed)
NAME:  Mario Reese, Mario Reese NO.:  [id number]  MEDICAL RECORD NO.:  [id number]  LOCATION:  1407                         FACILITY:  [facility]  PHYSICIAN:  Mario Ache, MD     DATE OF BIRTH:  September 10, 1946  DATE OF PROCEDURE: DATE OF DISCHARGE:                              OPERATIVE REPORT   DIAGNOSIS:  Bladder cancer.  PROCEDURES: 1. Transurethral resection of bladder tumor, volume large. 2. Bilateral retrograde pyelogram with interpretation.  FINDINGS: 1. Very large multifocal bladder tumor, papillary and sessile     components, the posterior wall dome, right side, occupying approximately     50% of the total bladder circumference. 2. Unremarkable bilateral retrograde pyelograms.  SPECIMEN: 1. Bladder tumor. 2. Base of posterior bladder tumor. 3. Base of superior bladder tumor. 4. Base of right wall bladder tumor.  COMPLICATIONS:  None.  ESTIMATED BLOOD LOSS:  50 mL.  INDICATION:  Mario Reese is a pleasant 66 year old gentleman with recent history of gross hematuria as well as long-time smoking history.  He underwent evaluation for this including CT scan and office cystoscopy was corroborated a very large bladder mass possibly metastatic as he does have some lung nodules, which appear to be progressing in size. Options were discussed for management including endoscopic evaluation for diagnostic and therapeutic purposes and he wished to proceed. Informed consent was obtained and placed in the medical record.  PROCEDURE IN DETAIL:  The patient being Mario Reese, was verified. Procedure being transurethral resection of bladder tumor was confirmed. Procedure was carried out.  Time-out was performed.  Intravenous antibiotics were administered.  General LMA anesthesia was introduced. The patient was placed into a low lithotomy position.  Sterile field was created by prepping and draping the patient's penis, perineum, and proximal thighs using iodine x3.  Next,  cystourethroscopy was performed using a 22-French rigid cystoscope with 30-degree offset lens. Inspection of the anterior and posterior urethra were unremarkable. Inspection of the urinary bladder revealed very large multifocal bladder tumor most consistent with the posterior aspect, superior aspect and right wall, that was present on all four quadrants of the bladder and did occupy approximately 50% of bladder circumference.  Notably, the area of the trigone and ureteral orifices were relatively uninvolved with this process and ureteral orifices were in normal anatomic position.  Attention was then directed to the retrograde pyelography. The right ureter was cannulated with a 6-French end-hole catheter and right retrograde pyelogram was seen.  Right retrograde pyelogram demonstrated a single right ureter with single system right kidney with no known filling defects or narrowing. Similarly, left retrograde pyelogram was obtained via 6-French end-hole catheter.  Left retrograde pyelogram demonstrated a single left ureter with single system left kidney without filling defects or narrowing.  The cystoscope was then exchanged for a 26-French gyrus-type continuous flow saline resectoscope using normal saline irrigation, systematic resection was performed of each tumor resecting it until appear to be flat with the bladder wall, taking great care to avoid bladder perforation, which did not occur, approximately 10 centimeter squared of surface area that was resected.  Next, cold cup biopsy forceps were used to take representative samples of the base of the  posterior tumor, posterior superior tumor and the right wall tumor and these were set aside separately for permanent pathology.  Repeat examination of the bladder and point of fulguration was performed using the resectoscope loop and coagulation current.  Hemostasis was appeared excellent.  All grossly visible bladder tumor had been  resected.  No perforation was noted.  The resectoscope was then exchanged for a 24-French Foley catheter, 3-way type per urethra, straight drain with 20 mL of sterile water in the balloon.  This was then connected to normal saline irrigation at approximately 1 drop per second with efflux that was light pink. Procedure was then terminated.  The patient tolerated the procedure well.  There were no immediate periprocedural complications.  The patient was taken to the postanesthesia care unit in stable condition. Notably, the patient will be admitted for observation postoperatively, undergo dedicated CT of chest today and possibly lung biopsy tomorrow of his apparent progressive lung lesions.          ______________________________ Mario Ache, MD     TM/MEDQ  D:  10/24/2012  T:  10/25/2012  Job:  [id number]

## 2012-10-26 MED ORDER — IOHEXOL 350 MG/ML SOLN
INTRAVENOUS | Status: DC | PRN
  Administered 2012-10-26: 5 mL

## 2012-10-26 MED ORDER — STERILE WATER FOR IRRIGATION IR SOLN
Status: DC | PRN
  Administered 2012-10-26: 10 mL

## 2012-10-26 MED ORDER — SODIUM CHLORIDE 0.9 % IR SOLN
Status: DC | PRN
  Administered 2012-10-26: 18000 mL via INTRAVESICAL

## 2012-10-26 MED ORDER — BELLADONNA ALKALOIDS-OPIUM 16.2-60 MG RE SUPP
RECTAL | Status: DC | PRN
  Administered 2012-10-26: 1 via RECTAL

## 2012-10-26 NOTE — OR Nursing (Signed)
10/26/2012 OR Nursing- addendum hard copy of OR Chart completed at time of surgery. Information entered into EPIC after computer issues resolved.

## 2012-10-29 ENCOUNTER — Encounter (HOSPITAL_BASED_OUTPATIENT_CLINIC_OR_DEPARTMENT_OTHER): Payer: Self-pay | Admitting: Urology

## 2012-10-30 ENCOUNTER — Encounter (HOSPITAL_COMMUNITY)
Admission: RE | Admit: 2012-10-30 | Discharge: 2012-10-30 | Disposition: A | Discharge status: Home / Self Care | Payer: 59 | Source: Ambulatory Visit | Attending: Urology | Admitting: Urology

## 2012-10-30 ENCOUNTER — Encounter (HOSPITAL_COMMUNITY): Payer: Self-pay

## 2012-10-30 DIAGNOSIS — K7689 Other specified diseases of liver: Secondary | ICD-10-CM | POA: Insufficient documentation

## 2012-10-30 DIAGNOSIS — J3489 Other specified disorders of nose and nasal sinuses: Secondary | ICD-10-CM | POA: Insufficient documentation

## 2012-10-30 DIAGNOSIS — R222 Localized swelling, mass and lump, trunk: Secondary | ICD-10-CM | POA: Insufficient documentation

## 2012-10-30 DIAGNOSIS — C679 Malignant neoplasm of bladder, unspecified: Secondary | ICD-10-CM | POA: Insufficient documentation

## 2012-10-30 DIAGNOSIS — I7789 Other specified disorders of arteries and arterioles: Secondary | ICD-10-CM | POA: Insufficient documentation

## 2012-10-30 DIAGNOSIS — R599 Enlarged lymph nodes, unspecified: Secondary | ICD-10-CM | POA: Insufficient documentation

## 2012-10-30 DIAGNOSIS — N289 Disorder of kidney and ureter, unspecified: Secondary | ICD-10-CM | POA: Insufficient documentation

## 2012-10-30 DIAGNOSIS — R918 Other nonspecific abnormal finding of lung field: Secondary | ICD-10-CM | POA: Insufficient documentation

## 2012-10-30 LAB — GLUCOSE, CAPILLARY: Glucose-Capillary: 192 mg/dL — ABNORMAL HIGH (ref 70–99)

## 2012-10-30 MED ORDER — FLUDEOXYGLUCOSE F - 18 (FDG) INJECTION
17.8000 | Freq: Once | INTRAVENOUS | Status: AC | PRN
  Administered 2012-10-30: 17.8 via INTRAVENOUS

## 2012-11-07 ENCOUNTER — Other Ambulatory Visit: Payer: Self-pay | Admitting: Urology

## 2012-11-21 NOTE — OR Nursing (Signed)
11/21/2012- Addendum- Changed surgery date. [facility]

## 2012-12-03 ENCOUNTER — Ambulatory Visit: Payer: Self-pay | Admitting: Internal Medicine

## 2012-12-14 ENCOUNTER — Ambulatory Visit: Admit: 2012-12-14 | Payer: Self-pay | Admitting: Urology

## 2012-12-14 SURGERY — TURBT (TRANSURETHRAL RESECTION OF BLADDER TUMOR)
Anesthesia: General

## 2013-04-01 ENCOUNTER — Other Ambulatory Visit: Payer: Self-pay | Admitting: Urology

## 2013-04-22 ENCOUNTER — Encounter (HOSPITAL_BASED_OUTPATIENT_CLINIC_OR_DEPARTMENT_OTHER): Payer: Self-pay | Admitting: *Deleted

## 2013-04-22 NOTE — Progress Notes (Signed)
NPO AFTER MN. ARRIVES AT 0700. NEEDS ISTAT 8. CURRENT EKG AND PET SCAN IN EPIC AND CHART. WILL TAKE METOPROLOL, NORVASC, PRILOSEC AND PRAVASTATIN AM OF SURG W/ SIPS OF WATER.

## 2013-04-23 NOTE — Anesthesia Preprocedure Evaluation (Signed)
Anesthesia Evaluation  Patient identified by MRN, date of birth, ID band Patient awake    Reviewed: Allergy & Precautions, H&P , NPO status , Patient's Chart, lab work & pertinent test results, reviewed documented beta blocker date and time   Airway Mallampati: III TM Distance: >3 FB Neck ROM: full    Dental  (+) Missing and Dental Advisory Given,    Pulmonary former smoker,  Multiple places on CXR that look like mets, but they got worked up and are not mets. breath sounds clear to auscultation  Pulmonary exam normal       Cardiovascular Exercise Tolerance: Good hypertension, Pt. on home beta blockers and Pt. on medications Rhythm:regular Rate:Normal     Neuro/Psych negative neurological ROS  negative psych ROS   GI/Hepatic negative GI ROS, Neg liver ROS, GERD-  Medicated and Controlled,  Endo/Other  diabetes, Well Controlled, Type 2, Oral Hypoglycemic AgentsMorbid obesity  Renal/GU negative Renal ROS     Musculoskeletal   Abdominal   Peds  Hematology negative hematology ROS (+)   Anesthesia Other Findings   Reproductive/Obstetrics                           Anesthesia Physical  Anesthesia Plan  ASA: III  Anesthesia Plan: General   Post-op Pain Management:    Induction: Intravenous  Airway Management Planned: LMA  Additional Equipment:   Intra-op Plan:   Post-operative Plan: Extubation in OR  Informed Consent: I have reviewed the patients History and Physical, chart, labs and discussed the procedure including the risks, benefits and alternatives for the proposed anesthesia with the patient or authorized representative who has indicated his/her understanding and acceptance.   Dental advisory given  Plan Discussed with: CRNA  Anesthesia Plan Comments:         Anesthesia Quick Evaluation

## 2013-04-24 ENCOUNTER — Encounter (HOSPITAL_BASED_OUTPATIENT_CLINIC_OR_DEPARTMENT_OTHER): Payer: Self-pay | Admitting: *Deleted

## 2013-04-24 ENCOUNTER — Ambulatory Visit (HOSPITAL_BASED_OUTPATIENT_CLINIC_OR_DEPARTMENT_OTHER)
Admission: RE | Admit: 2013-04-24 | Discharge: 2013-04-24 | Disposition: A | Discharge status: Home / Self Care | Payer: 59 | Source: Ambulatory Visit | Attending: Urology | Admitting: Urology

## 2013-04-24 ENCOUNTER — Encounter (HOSPITAL_BASED_OUTPATIENT_CLINIC_OR_DEPARTMENT_OTHER): Payer: Self-pay | Admitting: Anesthesiology

## 2013-04-24 ENCOUNTER — Encounter (HOSPITAL_BASED_OUTPATIENT_CLINIC_OR_DEPARTMENT_OTHER)
Admission: RE | Disposition: A | Discharge status: Home / Self Care | Payer: Self-pay | Source: Ambulatory Visit | Attending: Urology

## 2013-04-24 ENCOUNTER — Ambulatory Visit (HOSPITAL_BASED_OUTPATIENT_CLINIC_OR_DEPARTMENT_OTHER): Payer: 59 | Admitting: Anesthesiology

## 2013-04-24 DIAGNOSIS — E669 Obesity, unspecified: Secondary | ICD-10-CM | POA: Insufficient documentation

## 2013-04-24 DIAGNOSIS — C679 Malignant neoplasm of bladder, unspecified: Secondary | ICD-10-CM | POA: Insufficient documentation

## 2013-04-24 DIAGNOSIS — E785 Hyperlipidemia, unspecified: Secondary | ICD-10-CM | POA: Insufficient documentation

## 2013-04-24 DIAGNOSIS — R918 Other nonspecific abnormal finding of lung field: Secondary | ICD-10-CM | POA: Insufficient documentation

## 2013-04-24 DIAGNOSIS — E119 Type 2 diabetes mellitus without complications: Secondary | ICD-10-CM | POA: Insufficient documentation

## 2013-04-24 DIAGNOSIS — I10 Essential (primary) hypertension: Secondary | ICD-10-CM | POA: Insufficient documentation

## 2013-04-24 DIAGNOSIS — Z86718 Personal history of other venous thrombosis and embolism: Secondary | ICD-10-CM | POA: Insufficient documentation

## 2013-04-24 DIAGNOSIS — Z7901 Long term (current) use of anticoagulants: Secondary | ICD-10-CM | POA: Insufficient documentation

## 2013-04-24 DIAGNOSIS — K219 Gastro-esophageal reflux disease without esophagitis: Secondary | ICD-10-CM | POA: Insufficient documentation

## 2013-04-24 HISTORY — PX: TRANSURETHRAL RESECTION OF BLADDER TUMOR: SHX2575

## 2013-04-24 HISTORY — DX: Other nonspecific abnormal finding of lung field: R91.8

## 2013-04-24 HISTORY — PX: CYSTOSCOPY W/ RETROGRADES: SHX1426

## 2013-04-24 LAB — POCT I-STAT, CHEM 8
BUN: 26 mg/dL — ABNORMAL HIGH (ref 6–23)
Creatinine, Ser: 0.8 mg/dL (ref 0.50–1.35)
Glucose, Bld: 300 mg/dL — ABNORMAL HIGH (ref 70–99)
Hemoglobin: 15 g/dL (ref 13.0–17.0)
TCO2: 32 mmol/L (ref 0–100)

## 2013-04-24 SURGERY — TURBT (TRANSURETHRAL RESECTION OF BLADDER TUMOR)
Anesthesia: General | Site: Ureter | Wound class: Clean Contaminated

## 2013-04-24 MED ORDER — GENTAMICIN IN SALINE 1.6-0.9 MG/ML-% IV SOLN
80.0000 mg | INTRAVENOUS | Status: DC
  Filled 2013-04-24: qty 50

## 2013-04-24 MED ORDER — ACETAMINOPHEN 10 MG/ML IV SOLN
1000.0000 mg | Freq: Once | INTRAVENOUS | Status: DC | PRN
  Filled 2013-04-24: qty 100

## 2013-04-24 MED ORDER — IOHEXOL 300 MG/ML  SOLN
INTRAMUSCULAR | Status: DC | PRN
  Administered 2013-04-24: 15 mL

## 2013-04-24 MED ORDER — MITOMYCIN CHEMO FOR BLADDER INSTILLATION 40 MG
40.0000 mg | Freq: Once | INTRAVENOUS | Status: AC
  Administered 2013-04-24: 40 mg via INTRAVESICAL
  Filled 2013-04-24: qty 40

## 2013-04-24 MED ORDER — OXYCODONE HCL 5 MG PO TABS
5.0000 mg | ORAL_TABLET | Freq: Once | ORAL | Status: AC | PRN
  Administered 2013-04-24: 5 mg via ORAL
  Filled 2013-04-24: qty 1

## 2013-04-24 MED ORDER — MEPERIDINE HCL 25 MG/ML IJ SOLN
6.2500 mg | INTRAMUSCULAR | Status: DC | PRN
  Filled 2013-04-24: qty 1

## 2013-04-24 MED ORDER — OXYCODONE HCL 5 MG/5ML PO SOLN
5.0000 mg | Freq: Once | ORAL | Status: AC | PRN
  Filled 2013-04-24: qty 5

## 2013-04-24 MED ORDER — LACTATED RINGERS IV SOLN
INTRAVENOUS | Status: DC
  Administered 2013-04-24 (×3): via INTRAVENOUS
  Filled 2013-04-24: qty 1000

## 2013-04-24 MED ORDER — SULFAMETHOXAZOLE-TRIMETHOPRIM 400-80 MG PO TABS: 1.0000 | ORAL_TABLET | Freq: Two times a day (BID) | ORAL | Status: AC

## 2013-04-24 MED ORDER — GLYCOPYRROLATE 0.2 MG/ML IJ SOLN
INTRAMUSCULAR | Status: DC | PRN
  Administered 2013-04-24: .8 mg via INTRAVENOUS

## 2013-04-24 MED ORDER — ONDANSETRON HCL 4 MG/2ML IJ SOLN
INTRAMUSCULAR | Status: DC | PRN
  Administered 2013-04-24: 4 mg via INTRAVENOUS

## 2013-04-24 MED ORDER — FENTANYL CITRATE 0.05 MG/ML IJ SOLN
INTRAMUSCULAR | Status: DC | PRN
  Administered 2013-04-24: 50 ug via INTRAVENOUS
  Administered 2013-04-24: 100 ug via INTRAVENOUS
  Administered 2013-04-24: 50 ug via INTRAVENOUS

## 2013-04-24 MED ORDER — STERILE WATER FOR IRRIGATION IR SOLN
Status: DC | PRN
  Administered 2013-04-24: 1000 mL

## 2013-04-24 MED ORDER — SENNA-DOCUSATE SODIUM 8.6-50 MG PO TABS: 1.0000 | ORAL_TABLET | Freq: Two times a day (BID) | ORAL | Status: DC

## 2013-04-24 MED ORDER — GENTAMICIN SULFATE 40 MG/ML IJ SOLN
5.0000 mg/kg | Freq: Once | INTRAVENOUS | Status: AC
  Administered 2013-04-24 (×2): 500 mg via INTRAVENOUS
  Filled 2013-04-24: qty 16.28

## 2013-04-24 MED ORDER — PROMETHAZINE HCL 25 MG/ML IJ SOLN
6.2500 mg | INTRAMUSCULAR | Status: DC | PRN
  Filled 2013-04-24: qty 1

## 2013-04-24 MED ORDER — NEOSTIGMINE METHYLSULFATE 1 MG/ML IJ SOLN
INTRAMUSCULAR | Status: DC | PRN
  Administered 2013-04-24: 4 mg via INTRAVENOUS

## 2013-04-24 MED ORDER — LIDOCAINE HCL (CARDIAC) 20 MG/ML IV SOLN
INTRAVENOUS | Status: DC | PRN
  Administered 2013-04-24: 100 mg via INTRAVENOUS

## 2013-04-24 MED ORDER — MIDAZOLAM HCL 5 MG/5ML IJ SOLN
INTRAMUSCULAR | Status: DC | PRN
  Administered 2013-04-24: 2 mg via INTRAVENOUS

## 2013-04-24 MED ORDER — OXYCODONE-ACETAMINOPHEN 5-325 MG PO TABS: 1.0000 | ORAL_TABLET | ORAL | Status: DC | PRN

## 2013-04-24 MED ORDER — PROPOFOL 10 MG/ML IV BOLUS
INTRAVENOUS | Status: DC | PRN
  Administered 2013-04-24: 300 mg via INTRAVENOUS

## 2013-04-24 MED ORDER — ROCURONIUM BROMIDE 100 MG/10ML IV SOLN
INTRAVENOUS | Status: DC | PRN
  Administered 2013-04-24 (×3): 10 mg via INTRAVENOUS
  Administered 2013-04-24: 50 mg via INTRAVENOUS

## 2013-04-24 MED ORDER — HYDROMORPHONE HCL PF 1 MG/ML IJ SOLN
0.2500 mg | INTRAMUSCULAR | Status: DC | PRN
  Administered 2013-04-24 (×2): 0.25 mg via INTRAVENOUS
  Filled 2013-04-24: qty 1

## 2013-04-24 MED ORDER — SODIUM CHLORIDE 0.9 % IR SOLN
Status: DC | PRN
  Administered 2013-04-24: 6000 mL via INTRAVESICAL

## 2013-04-24 MED ORDER — PHENAZOPYRIDINE HCL 200 MG PO TABS
200.0000 mg | ORAL_TABLET | Freq: Three times a day (TID) | ORAL | Status: AC
  Administered 2013-04-24: 200 mg via ORAL
  Filled 2013-04-24: qty 1

## 2013-04-24 SURGICAL SUPPLY — 34 items
BAG URINE DRAINAGE (UROLOGICAL SUPPLIES) ×6 IMPLANT
BAG URINE LEG 19OZ MD ST LTX (BAG) IMPLANT
BAG URINE LEG 500ML (DRAIN) IMPLANT
BAG URO CATCHER STRL LF (DRAPE) ×3 IMPLANT
BASKET ZERO TIP NITINOL 2.4FR (BASKET) IMPLANT
CATH FOLEY 2WAY SLVR  5CC 22FR (CATHETERS)
CATH FOLEY 2WAY SLVR 30CC 20FR (CATHETERS) IMPLANT
CATH FOLEY 2WAY SLVR 5CC 22FR (CATHETERS) IMPLANT
CATH FOLEY 3WAY 30CC 22F (CATHETERS) ×3 IMPLANT
CATH INTERMIT  6FR 70CM (CATHETERS) IMPLANT
CLOTH BEACON ORANGE TIMEOUT ST (SAFETY) ×3 IMPLANT
DRAPE CAMERA CLOSED 9X96 (DRAPES) ×3 IMPLANT
ELECT LOOP MED HF 24F 12D (CUTTING LOOP) ×3 IMPLANT
ELECT REM PT RETURN 9FT ADLT (ELECTROSURGICAL) ×3
ELECTRODE REM PT RTRN 9FT ADLT (ELECTROSURGICAL) ×2 IMPLANT
EVACUATOR MICROVAS BLADDER (UROLOGICAL SUPPLIES) IMPLANT
GLOVE BIO SURGEON STRL SZ 6 (GLOVE) ×3 IMPLANT
GLOVE BIO SURGEON STRL SZ 6.5 (GLOVE) ×3 IMPLANT
GLOVE BIO SURGEON STRL SZ7 (GLOVE) ×3 IMPLANT
GLOVE BIO SURGEON STRL SZ7.5 (GLOVE) ×3 IMPLANT
GLOVE ECLIPSE 6.5 STRL STRAW (GLOVE) ×3 IMPLANT
GOWN PREVENTION PLUS XLARGE (GOWN DISPOSABLE) ×3 IMPLANT
GOWN STRL NON-REIN LRG LVL3 (GOWN DISPOSABLE) ×9 IMPLANT
GUIDEWIRE ANG ZIPWIRE 038X150 (WIRE) IMPLANT
GUIDEWIRE STR DUAL SENSOR (WIRE) ×3 IMPLANT
HOLDER FOLEY CATH W/STRAP (MISCELLANEOUS) ×3 IMPLANT
IV NS IRRIG 3000ML ARTHROMATIC (IV SOLUTION) ×6 IMPLANT
KIT ASPIRATION TUBING (SET/KITS/TRAYS/PACK) IMPLANT
LOOP CUTTING 24FR OLYMPUS (CUTTING LOOP) IMPLANT
LOOP CUTTING 26FR OLYMPUS (CUTTING LOOP) IMPLANT
PACK CYSTOSCOPY (CUSTOM PROCEDURE TRAY) ×3 IMPLANT
SET ASPIRATION TUBING (TUBING) IMPLANT
SYR 30ML LL (SYRINGE) ×3 IMPLANT
SYRINGE IRR TOOMEY STRL 70CC (SYRINGE) ×3 IMPLANT

## 2013-04-24 NOTE — Transfer of Care (Signed)
Immediate Anesthesia Transfer of Care Note  Patient: Mario Reese  Procedure(s) Performed: Procedure(s): TRANSURETHRAL RESECTION OF BLADDER TUMOR (TURBT) (N/A) CYSTOSCOPY WITH  Bilateral RETROGRADE PYELOGRAM (Bilateral)  Patient Location: PACU  Anesthesia Type:General  Level of Consciousness: awake, alert  and oriented  Airway & Oxygen Therapy: Patient Spontanous Breathing and Patient connected to nasal cannula oxygen  Post-op Assessment: Report given to PACU RN  Post vital signs: Reviewed and stable  Complications: No apparent anesthesia complications

## 2013-04-24 NOTE — Brief Op Note (Signed)
04/24/2013  9:47 AM  PATIENT:  Mario Reese  67 y.o. male  PRE-OPERATIVE DIAGNOSIS:  BLADDER CANCER  POST-OPERATIVE DIAGNOSIS:  BLADDER CANCER  PROCEDURE:  Procedure(s): TRANSURETHRAL RESECTION OF BLADDER TUMOR (TURBT) (N/A) CYSTOSCOPY WITH  Bilateral RETROGRADE PYELOGRAM (Bilateral)  SURGEON:  Surgeon(s) and Role:    * Sebastian Ache, MD - Primary  PHYSICIAN ASSISTANT:   ASSISTANTS: none   ANESTHESIA:   general  EBL:  Total I/O In: 1000 [I.V.:1000] Out: -   BLOOD ADMINISTERED:none  DRAINS: Foley to straight drain   LOCAL MEDICATIONS USED:  NONE  SPECIMEN:  Source of Specimen:  1 - bladder tumor, 2 - base of bladder tumor, 3- bladder erythema  DISPOSITION OF SPECIMEN:  PATHOLOGY  COUNTS:  YES  TOURNIQUET:  * No tourniquets in log *  DICTATION: .Other Dictation: Dictation Number [id number]  PLAN OF CARE: Discharge to home after PACU  PATIENT DISPOSITION:  PACU - hemodynamically stable.   Delay start of Pharmacological VTE agent (>24hrs) due to surgical blood loss or risk of bleeding: not applicable

## 2013-04-24 NOTE — Anesthesia Procedure Notes (Signed)
Procedure Name: Intubation Date/Time: 04/24/2013 8:34 AM Performed by: Maris Berger T Pre-anesthesia Checklist: Patient identified, Emergency Drugs available, Suction available and Patient being monitored Patient Re-evaluated:Patient Re-evaluated prior to inductionOxygen Delivery Method: Circle System Utilized Preoxygenation: Pre-oxygenation with 100% oxygen Intubation Type: IV induction Ventilation: Mask ventilation without difficulty Laryngoscope Size: Mac and 4 Grade View: Grade I Tube type: Oral Tube size: 8.0 mm Number of attempts: 1 Airway Equipment and Method: stylet,  oral airway and LTA kit utilized Placement Confirmation: ETT inserted through vocal cords under direct vision,  positive ETCO2 and breath sounds checked- equal and bilateral Secured at: 23 cm Tube secured with: Tape Dental Injury: Teeth and Oropharynx as per pre-operative assessment

## 2013-04-24 NOTE — Anesthesia Postprocedure Evaluation (Signed)
Anesthesia Post Note  Patient: Mario Reese  Procedure(s) Performed: Procedure(s) (LRB): TRANSURETHRAL RESECTION OF BLADDER TUMOR (TURBT) (N/A) CYSTOSCOPY WITH  Bilateral RETROGRADE PYELOGRAM (Bilateral)  Anesthesia type: General  Patient location: PACU  Post pain: Pain level controlled  Post assessment: Post-op Vital signs reviewed  Last Vitals: BP 130/67  Pulse 60  Temp(Src) 36.1 C (Oral)  Resp 17  Ht 6\' 2"  (1.88 m)  Wt 287 lb (130.182 kg)  BMI 36.83 kg/m2  SpO2 93%  Post vital signs: Reviewed  Level of consciousness: sedated  Complications: No apparent anesthesia complications

## 2013-04-24 NOTE — H&P (Signed)
Mario Reese is an 67 y.o. male.    Chief Complaint: Pre-OP Transurethral Resection Bladder Tumor  HPI:   1 - Bladder Cancer - Staging 2013 CT with large bilateral pulmonary nodules that are not hypermetabolic on PET-CT.  30 PY smoker, now quit.  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   Recent Treatment / Surveillance History: 10/24/2012 - TURBT / RPG - T1G3  --> developed DVT / PT placed on anticoagulation and not recieve re-staging TURBT  02/2013 - cysto with recurrent / new tumro at dome and diffuse posterior erythema ? CIS  PMH sig for Obesity, DM2. No CV disease. DVT (now off blood thinners).   Today Mario Reese is seen to proceed with TURBT and retrogrades for his recurrent bladder cancer.  Past Medical History  Diagnosis Date  . Hypertension   . Hyperlipidemia   . Diabetes mellitus, type 2   . GERD (gastroesophageal reflux disease)   . Frequency of urination   . Urgency of urination   . Nocturia   . Bladder cancer   . Multiple pulmonary nodules BILATERAL -- NOT METS PER PET SCAN    Past Surgical History  Procedure Laterality Date  . Transurethral resection of bladder tumor  10/24/2012    Procedure: TRANSURETHRAL RESECTION OF BLADDER TUMOR (TURBT);  Surgeon: Sebastian Ache, MD;  Location: [facility];  Service: Urology;  Laterality: N/A;  ATHY   . Cystoscopy with ureteroscopy  10/24/2012    Procedure: CYSTOSCOPY WITH URETEROSCOPY;  Surgeon: Sebastian Ache, MD;  Location: [facility];  Service: Urology;  Laterality: Bilateral;  retrograde and pyelogram    History reviewed. No pertinent family history. Social History:  reports that he quit smoking about 18 months ago. His smoking use included Cigarettes. He has a 30 pack-year smoking history. He has never used smokeless tobacco. He reports that he drinks about 21.6 ounces of alcohol per week. He reports that he does not use illicit drugs.  Allergies: No Known  Allergies  No prescriptions prior to admission    No results found for this or any previous visit (from the past 48 hour(s)). No results found.  Review of Systems  Constitutional: Negative.  Negative for fever, chills and weight loss.  HENT: Negative.   Eyes: Negative.   Respiratory: Negative.   Cardiovascular: Positive for leg swelling. Negative for chest pain.  Gastrointestinal: Negative.   Genitourinary: Negative.  Negative for dysuria, hematuria and flank pain.  Musculoskeletal: Negative.   Skin: Negative.   Neurological: Negative.   Endo/Heme/Allergies: Negative.   Psychiatric/Behavioral: Negative.     Height 6\' 2"  (1.88 m), weight 127.007 kg (280 lb). Physical Exam  Constitutional: He is oriented to person, place, and time. He appears well-developed and well-nourished.  obese  HENT:  Head: Normocephalic and atraumatic.  Eyes: EOM are normal. Pupils are equal, round, and reactive to light.  Neck: Normal range of motion. Neck supple.  Cardiovascular: Normal rate.   Respiratory: Effort normal and breath sounds normal.  GI: Soft. Bowel sounds are normal.  Genitourinary: Penis normal.  Musculoskeletal: Normal range of motion.  Neurological: He is alert and oriented to person, place, and time.  Skin: Skin is warm and dry.  Psychiatric: He has a normal mood and affect. His behavior is normal. Judgment and thought content normal.     Assessment/Plan  1 - Bladder Cancer - -  Recurrent disease by recent office cysto. Now off anticoagulation, will proceed wtih TURBT again  to treat new tumor / restage. Long term, he will clearly need BCG induction v. cystectomy based on path for this multifocal high-grade tumor.  We re-discussed operative biopsy / transurethral resection as the best next step for diagnostic and therapeutic purposes with goals being to remove all visible cancer and obtain tissue for pathologic exam. We re-discussed that for some low-grade tumors, this may be all  the treatment required, but that for many other tumors such as high-grade lesions, further therapy including surgery and or chemotherapy may be warranted. We also outlined the fact that any bladder cancer diagnosis will require close follow-up with periodic upper and lower tract evaluation. We re-discussed risks including bleeding, infection, damage to kidney / ureter / bladder including bladder perforation which can typically managed with prolonged foley catheterization. We mentioned anesthetic and other rare risks including DVT, PE, MI, and mortality. I also mentioned that adjunctive procedures such as ureteral stenting, retrograde pyelography, and ureteroscopy may be necessary to fully evaluate the urinary tract depending on intra-operative findings. After answering all questions to the patient's satisfaction, they wish to proceed.   [name] 04/24/2013, 6:22 AM

## 2013-04-24 NOTE — Preoperative (Signed)
Beta Blockers   Reason not to administer Beta Blockers:Not Applicable 

## 2013-04-25 ENCOUNTER — Encounter (HOSPITAL_BASED_OUTPATIENT_CLINIC_OR_DEPARTMENT_OTHER): Payer: Self-pay | Admitting: Urology

## 2013-04-25 NOTE — Op Note (Signed)
NAME:  Mario Reese, Mario Reese NO.:  [id number]  MEDICAL RECORD NO.:  [id number]  LOCATION:                                 FACILITY:  PHYSICIAN:  Sebastian Ache, MD     DATE OF BIRTH:  1946-01-08  DATE OF PROCEDURE: 04/24/2013 DATE OF DISCHARGE:                              OPERATIVE REPORT   PREOPERATIVE DIAGNOSIS:  Recurrent bladder cancer.  PROCEDURES: 1. Transresection of bladder tumor, volume large. 2. Bilateral pyelogram interpretation. 3. Instillation of chemotherapeutic mitomycin-C.  COMPLICATIONS:  None.  SPECIMENS: 1. Bladder tumor. 2. Base of bladder tumor. 3. Bladder erythema.  FINDINGS: 1. Very large recurrence of bladder tumor mostly at the dome and left     wall.  Total volume approximately 9 cm2. 2. Diffuse bladder erythema. 3. Unremarkable bilateral retrograde pyelograms.  ESTIMATED BLOOD LOSS:  Less than 50 mL.  DRAINS:  A 22-French Foley catheter straight drain.  INDICATION:  Mr. Mario Reese is a pleasant 67 year old gentleman with a history of hematuria and T1G3 bladder cancer, who underwent resection several months ago.  He developed subsequently a DVT requiring anticoagulation, and he did not have a restaging resection.  He is now off anticoagulation.  He had surveillance cystoscopy last month in the office, which revealed multifocal large recurrence of tumor.  Options were discussed including palliative efforts versus endoscopic resection. He wished to proceed with the latter.  Informed consents was obtained and placed in medical record.  PROCEDURE IN DETAIL:  The patient being Mario Reese was verified. Procedure being trans-resection of bladder tumor was confirmed. Procedure was carried out.  Time-out was performed.  Intravenous antibiotics administered.  General endotracheal anesthesia was introduced.  The patient was placed into a low lithotomy position. Sterile field was created prepping and draping the patient's penis, perineum, and  proximal thighs using iodine x3.  Next, cystourethroscopy was performed using a 22-French rigid cystoscope with 12-degree offset lens.  Inspection of anterior and posterior urethra were unremarkable. Inspection of the bladder revealed multifocal large papillary bladder tumor centered mostly up and down left wall.  There was beefy red erythema of the posterior wall on right aspect of the bladder.  Ureteral orifices were normal in anatomic position.  Attention was directed to the retrograde pyelograms.  The right ureteral orifice was cannulated using a 6-French end-hole catheter and right retrograde pyelogram was seen.  Right retrograde pyelogram demonstrated a single right ureter single system right kidney, no filling defects or narrowing noted.  Similarly left retrograde pyelogram was obtained.  This revealed single system left kidney and ureter, no filling defects or narrowing.  There is prompt drainage on both sides with delayed films.  Attention was then directed to resection of bladder tumor.  This was performed very carefully in a top-down approach using the gyrus bipolar resectoscope loop at settings at 120/100.  Resection was carried down to the fibromuscular fibers of the bladder wall taking great care to avoid perforation or injury to the ureteral orifice and neither of which occurred.  Bladder tumor fragments were set aside and labeled bladder tumor.  A separate cold cup biopsy were taken of the area of red erythema and at  the base of bladder tumor and these were set aside separately.  Repeat inspection revealed excellent hemostasis.  No evidence of bladder perforation.  A 22-French 3-way Foley catheter was placed for a straight drain with the irrigation port plugged.  The patient was taken to postanesthesia care unit in stable condition.  In the PACU, the patient underwent instillation of mitomycin-C with 40 mg and 40 mL of fluid.  This was instilled in the urinary bladder and  held for 1 hour and then drained.  The patient will be discharged with this Foley catheter with trial of void later this week.          ______________________________ Sebastian Ache, MD     TM/MEDQ  D:  04/24/2013  T:  04/25/2013  Job:  [id number]

## 2014-11-25 ENCOUNTER — Other Ambulatory Visit: Payer: Self-pay | Admitting: Urology

## 2014-11-25 NOTE — Progress Notes (Signed)
Please put orders in Epic under sign and held surgery 12-02-14 pre op 12-01-14 Thanks

## 2014-12-01 ENCOUNTER — Ambulatory Visit (HOSPITAL_COMMUNITY)
Admission: RE | Admit: 2014-12-01 | Discharge: 2014-12-01 | Disposition: A | Discharge status: Home / Self Care | Payer: Medicare Other | Source: Ambulatory Visit | Attending: Anesthesiology | Admitting: Anesthesiology

## 2014-12-01 ENCOUNTER — Encounter (HOSPITAL_COMMUNITY)
Admission: RE | Admit: 2014-12-01 | Discharge: 2014-12-01 | Disposition: A | Discharge status: Home / Self Care | Payer: Medicare Other | Source: Ambulatory Visit | Attending: Urology | Admitting: Urology

## 2014-12-01 ENCOUNTER — Encounter (HOSPITAL_COMMUNITY): Payer: Self-pay

## 2014-12-01 DIAGNOSIS — K219 Gastro-esophageal reflux disease without esophagitis: Secondary | ICD-10-CM | POA: Diagnosis not present

## 2014-12-01 DIAGNOSIS — Z7982 Long term (current) use of aspirin: Secondary | ICD-10-CM | POA: Diagnosis not present

## 2014-12-01 DIAGNOSIS — I10 Essential (primary) hypertension: Secondary | ICD-10-CM

## 2014-12-01 DIAGNOSIS — N359 Urethral stricture, unspecified: Secondary | ICD-10-CM | POA: Diagnosis not present

## 2014-12-01 DIAGNOSIS — Z87891 Personal history of nicotine dependence: Secondary | ICD-10-CM | POA: Diagnosis not present

## 2014-12-01 DIAGNOSIS — N3289 Other specified disorders of bladder: Secondary | ICD-10-CM | POA: Diagnosis not present

## 2014-12-01 DIAGNOSIS — E119 Type 2 diabetes mellitus without complications: Secondary | ICD-10-CM | POA: Diagnosis not present

## 2014-12-01 DIAGNOSIS — Z8551 Personal history of malignant neoplasm of bladder: Secondary | ICD-10-CM | POA: Diagnosis not present

## 2014-12-01 DIAGNOSIS — R310 Gross hematuria: Secondary | ICD-10-CM | POA: Diagnosis present

## 2014-12-01 DIAGNOSIS — E785 Hyperlipidemia, unspecified: Secondary | ICD-10-CM | POA: Diagnosis not present

## 2014-12-01 LAB — BASIC METABOLIC PANEL
ANION GAP: 12 (ref 5–15)
BUN: 16 mg/dL (ref 6–23)
CALCIUM: 9.7 mg/dL (ref 8.4–10.5)
CO2: 28 mEq/L (ref 19–32)
CREATININE: 0.74 mg/dL (ref 0.50–1.35)
Chloride: 101 mEq/L (ref 96–112)
GFR calc non Af Amer: >90 mL/min (ref >90)
Glucose, Bld: 144 mg/dL — ABNORMAL HIGH (ref 70–99)
Potassium: 4 mEq/L (ref 3.7–5.3)
Sodium: 141 mEq/L (ref 137–147)

## 2014-12-01 LAB — CBC WITH DIFFERENTIAL/PLATELET
BASOS ABS: 0 10*3/uL (ref 0.0–0.1)
BASOS PCT: 1 % (ref 0–1)
EOS ABS: 0.2 10*3/uL (ref 0.0–0.7)
Eosinophils Relative: 4 % (ref 0–5)
HCT: 46.4 % (ref 39.0–52.0)
Hemoglobin: 16.6 g/dL (ref 13.0–17.0)
Lymphocytes Relative: 21 % (ref 12–46)
Lymphs Abs: 1.3 10*3/uL (ref 0.7–4.0)
MCH: 34.2 pg — AB (ref 26.0–34.0)
MCHC: 35.8 g/dL (ref 30.0–36.0)
MCV: 95.7 fL (ref 78.0–100.0)
MONO ABS: 0.7 10*3/uL (ref 0.1–1.0)
Monocytes Relative: 11 % (ref 3–12)
NEUTROS ABS: 3.9 10*3/uL (ref 1.7–7.7)
NEUTROS PCT: 63 % (ref 43–77)
Platelets: 123 10*3/uL — ABNORMAL LOW (ref 150–400)
RBC: 4.85 MIL/uL (ref 4.22–5.81)
RDW: 12.2 % (ref 11.5–15.5)
WBC: 6.1 10*3/uL (ref 4.0–10.5)

## 2014-12-01 MED ORDER — GENTAMICIN SULFATE 40 MG/ML IJ SOLN
480.0000 mg | INTRAMUSCULAR | Status: AC
  Administered 2014-12-02: 480 mg via INTRAVENOUS
  Filled 2014-12-01 (×2): qty 12

## 2014-12-01 NOTE — Progress Notes (Signed)
Final EKG done 12/01/14 in EPIC.

## 2014-12-01 NOTE — Patient Instructions (Addendum)
[place]  12/01/2014   Your procedure is scheduled on:  12-15 -2015  Enter through [facility] Entrance and follow signs to [facility]. Arrive at      0530  AM..  Call this number if you have problems the morning of surgery: [phone]  Or Presurgical Testing [phone].   For Living Will and/or Health Care Power Attorney Forms: please provide copy for your medical record,may bring AM of surgery(Forms should be already notarized -we do not provide this service).(12-01-14 Yes/ No information preferred today). . For Cpap use: Bring mask and tubing only.   Do not eat food/ or drink: After Midnight.      Take these medicines the morning of surgery with A SIP OF WATER: Amlodipine. Finasteride. Metoprolol.Omeprazole. Pravastatin.   Do not wear jewelry, make-up or nail polish.  Do not wear deodorant, lotions, powders, or perfumes.   Do not shave legs and under arms- 48 hours(2 days) prior to first CHG shower.(Shaving face and neck okay.)  Do not bring valuables to the hospital.(Hospital is not responsible for lost valuables).  Contacts, dentures or removable bridgework, body piercing, hair pins may not be worn into surgery.  Leave suitcase in the car. After surgery it may be brought to your room.  For patients admitted to the hospital, checkout time is 11:00 AM the day of discharge.(Restricted visitors-Any Persons displaying flu-like symptoms or illness).    Patients discharged the day of surgery will not be allowed to drive home. Must have responsible person with you x 24 hours once discharged.  Name and phone number of your driver: Juston Bellow other- [phone]     Please read over the following fact sheets that you were given:  CHG(Chlorhexidine Gluconate 4% Surgical Soap) use, MRSA Information, Blood Transfusion fact sheet, Incentive Spirometry Instruction.  Remember : Type/Screen "Blue armbands" - may not be removed once applied(would result in  being retested AM of surgery, if removed).         [facility] - Preparing for Surgery Before surgery, you can play an important role.  Because skin is not sterile, your skin needs to be as free of germs as possible.  You can reduce the number of germs on your skin by washing with CHG (chlorahexidine gluconate) soap before surgery.  CHG is an antiseptic cleaner which kills germs and bonds with the skin to continue killing germs even after washing. Please DO NOT use if you have an allergy to CHG or antibacterial soaps.  If your skin becomes reddened/irritated stop using the CHG and inform your nurse when you arrive at Short Stay. Do not shave (including legs and underarms) for at least 48 hours prior to the first CHG shower.  You may shave your face/neck. Please follow these instructions carefully:  1.  Shower with CHG Soap the night before surgery and the  morning of Surgery.  2.  If you choose to wash your hair, wash your hair first as usual with your  normal  shampoo.  3.  After you shampoo, rinse your hair and body thoroughly to remove the  shampoo.                           4.  Use CHG as you would any other liquid soap.  You can apply chg directly  to the skin and wash  Gently with a scrungie or clean washcloth.  5.  Apply the CHG Soap to your body ONLY FROM THE NECK DOWN.   Do not use on face/ open                           Wound or open sores. Avoid contact with eyes, ears mouth and genitals (private parts).                       Wash face,  Genitals (private parts) with your normal soap.             6.  Wash thoroughly, paying special attention to the area where your surgery  will be performed.  7.  Thoroughly rinse your body with warm water from the neck down.  8.  DO NOT shower/wash with your normal soap after using and rinsing off  the CHG Soap.                9.  Pat yourself dry with a clean towel.            10.  Wear clean pajamas.            11.  Place clean  sheets on your bed the night of your first shower and do not  sleep with pets. Day of Surgery : Do not apply any lotions/deodorants the morning of surgery.  Please wear clean clothes to the hospital/surgery center.  FAILURE TO FOLLOW THESE INSTRUCTIONS MAY RESULT IN THE CANCELLATION OF YOUR SURGERY PATIENT SIGNATURE_________________________________  NURSE SIGNATURE__________________________________  ________________________________________________________________________

## 2014-12-01 NOTE — Progress Notes (Signed)
CBC results done 12/01/2014 faxed via EPIC to Dr Tresa Moore.

## 2014-12-02 ENCOUNTER — Encounter (HOSPITAL_COMMUNITY)
Admission: RE | Disposition: A | Discharge status: Home / Self Care | Payer: Self-pay | Source: Ambulatory Visit | Attending: Urology

## 2014-12-02 ENCOUNTER — Ambulatory Visit (HOSPITAL_COMMUNITY): Payer: Medicare Other | Admitting: Anesthesiology

## 2014-12-02 ENCOUNTER — Encounter (HOSPITAL_COMMUNITY): Payer: Self-pay | Admitting: *Deleted

## 2014-12-02 ENCOUNTER — Ambulatory Visit (HOSPITAL_COMMUNITY)
Admission: RE | Admit: 2014-12-02 | Discharge: 2014-12-02 | Disposition: A | Discharge status: Home / Self Care | Payer: Medicare Other | Source: Ambulatory Visit | Attending: Urology | Admitting: Urology

## 2014-12-02 DIAGNOSIS — N3289 Other specified disorders of bladder: Secondary | ICD-10-CM | POA: Diagnosis not present

## 2014-12-02 DIAGNOSIS — Z7982 Long term (current) use of aspirin: Secondary | ICD-10-CM | POA: Insufficient documentation

## 2014-12-02 DIAGNOSIS — I10 Essential (primary) hypertension: Secondary | ICD-10-CM | POA: Insufficient documentation

## 2014-12-02 DIAGNOSIS — R310 Gross hematuria: Secondary | ICD-10-CM | POA: Insufficient documentation

## 2014-12-02 DIAGNOSIS — Z87891 Personal history of nicotine dependence: Secondary | ICD-10-CM | POA: Insufficient documentation

## 2014-12-02 DIAGNOSIS — N359 Urethral stricture, unspecified: Secondary | ICD-10-CM | POA: Insufficient documentation

## 2014-12-02 DIAGNOSIS — E785 Hyperlipidemia, unspecified: Secondary | ICD-10-CM | POA: Insufficient documentation

## 2014-12-02 DIAGNOSIS — K219 Gastro-esophageal reflux disease without esophagitis: Secondary | ICD-10-CM | POA: Insufficient documentation

## 2014-12-02 DIAGNOSIS — Z8551 Personal history of malignant neoplasm of bladder: Secondary | ICD-10-CM | POA: Insufficient documentation

## 2014-12-02 DIAGNOSIS — E119 Type 2 diabetes mellitus without complications: Secondary | ICD-10-CM | POA: Diagnosis not present

## 2014-12-02 HISTORY — PX: CYSTOSCOPY WITH BIOPSY: SHX5122

## 2014-12-02 HISTORY — PX: CYSTOSCOPY W/ RETROGRADES: SHX1426

## 2014-12-02 LAB — GLUCOSE, CAPILLARY: Glucose-Capillary: 154 mg/dL — ABNORMAL HIGH (ref 70–99)

## 2014-12-02 SURGERY — CYSTOSCOPY, WITH BIOPSY
Anesthesia: General | Site: Urethra

## 2014-12-02 MED ORDER — LIDOCAINE HCL (CARDIAC) 20 MG/ML IV SOLN
INTRAVENOUS | Status: AC
  Filled 2014-12-02: qty 5

## 2014-12-02 MED ORDER — OXYCODONE HCL 5 MG/5ML PO SOLN
5.0000 mg | Freq: Once | ORAL | Status: DC | PRN
  Filled 2014-12-02: qty 5

## 2014-12-02 MED ORDER — MIDAZOLAM HCL 2 MG/2ML IJ SOLN
INTRAMUSCULAR | Status: AC
  Filled 2014-12-02: qty 2

## 2014-12-02 MED ORDER — STERILE WATER FOR IRRIGATION IR SOLN
Status: DC | PRN
  Administered 2014-12-02: 3000 mL

## 2014-12-02 MED ORDER — FENTANYL CITRATE 0.05 MG/ML IJ SOLN
INTRAMUSCULAR | Status: AC
Start: 2014-12-02 — End: 2014-12-02
  Filled 2014-12-02: qty 2

## 2014-12-02 MED ORDER — PROMETHAZINE HCL 25 MG/ML IJ SOLN
6.2500 mg | INTRAMUSCULAR | Status: DC | PRN
  Administered 2014-12-02: 6.25 mg via INTRAVENOUS

## 2014-12-02 MED ORDER — HYDROMORPHONE HCL 1 MG/ML IJ SOLN
0.2500 mg | INTRAMUSCULAR | Status: DC | PRN
  Administered 2014-12-02 (×2): 0.5 mg via INTRAVENOUS

## 2014-12-02 MED ORDER — MIDAZOLAM HCL 5 MG/5ML IJ SOLN
INTRAMUSCULAR | Status: DC | PRN
  Administered 2014-12-02: 2 mg via INTRAVENOUS

## 2014-12-02 MED ORDER — OXYCODONE HCL 5 MG PO TABS: 5.0000 mg | ORAL_TABLET | Freq: Once | ORAL | Status: DC | PRN

## 2014-12-02 MED ORDER — SENNA-DOCUSATE SODIUM 8.6-50 MG PO TABS
1.0000 | ORAL_TABLET | Freq: Two times a day (BID) | ORAL | Status: DC
Start: 2014-12-02 — End: 2015-01-22

## 2014-12-02 MED ORDER — ONDANSETRON HCL 4 MG/2ML IJ SOLN
INTRAMUSCULAR | Status: AC
  Filled 2014-12-02: qty 2

## 2014-12-02 MED ORDER — LACTATED RINGERS IV SOLN
INTRAVENOUS | Status: DC | PRN
  Administered 2014-12-02: 07:00:00 via INTRAVENOUS

## 2014-12-02 MED ORDER — ONDANSETRON HCL 4 MG/2ML IJ SOLN
INTRAMUSCULAR | Status: DC | PRN
  Administered 2014-12-02: 4 mg via INTRAVENOUS

## 2014-12-02 MED ORDER — LIDOCAINE HCL (CARDIAC) 20 MG/ML IV SOLN
INTRAVENOUS | Status: DC | PRN
  Administered 2014-12-02: 50 mg via INTRAVENOUS

## 2014-12-02 MED ORDER — METOPROLOL SUCCINATE ER 100 MG PO TB24
100.0000 mg | ORAL_TABLET | Freq: Every day | ORAL | Status: DC
  Administered 2014-12-02: 100 mg via ORAL
  Filled 2014-12-02: qty 1

## 2014-12-02 MED ORDER — PROMETHAZINE HCL 25 MG/ML IJ SOLN
INTRAMUSCULAR | Status: AC
  Filled 2014-12-02: qty 1

## 2014-12-02 MED ORDER — OXYCODONE-ACETAMINOPHEN 5-325 MG PO TABS: 1.0000 | ORAL_TABLET | ORAL | Status: DC | PRN

## 2014-12-02 MED ORDER — HYDROMORPHONE HCL 1 MG/ML IJ SOLN
INTRAMUSCULAR | Status: AC
  Filled 2014-12-02: qty 1

## 2014-12-02 MED ORDER — PROPOFOL 10 MG/ML IV BOLUS
INTRAVENOUS | Status: AC
  Filled 2014-12-02: qty 20

## 2014-12-02 MED ORDER — MEPERIDINE HCL 50 MG/ML IJ SOLN: 6.2500 mg | INTRAMUSCULAR | Status: DC | PRN

## 2014-12-02 MED ORDER — PROPOFOL 10 MG/ML IV BOLUS
INTRAVENOUS | Status: DC | PRN
  Administered 2014-12-02: 50 mg via INTRAVENOUS
  Administered 2014-12-02: 200 mg via INTRAVENOUS

## 2014-12-02 MED ORDER — DEXAMETHASONE SODIUM PHOSPHATE 10 MG/ML IJ SOLN: INTRAMUSCULAR | Status: DC | PRN

## 2014-12-02 MED ORDER — FENTANYL CITRATE 0.05 MG/ML IJ SOLN
INTRAMUSCULAR | Status: DC | PRN
  Administered 2014-12-02 (×2): 50 ug via INTRAVENOUS

## 2014-12-02 SURGICAL SUPPLY — 20 items
BAG URINE DRAINAGE (UROLOGICAL SUPPLIES) ×4 IMPLANT
BASKET LASER NITINOL 1.9FR (BASKET) IMPLANT
BASKET STNLS GEMINI 4WIRE 3FR (BASKET) IMPLANT
BASKET ZERO TIP NITINOL 2.4FR (BASKET) IMPLANT
CATH INTERMIT  6FR 70CM (CATHETERS) IMPLANT
CLOTH BEACON ORANGE TIMEOUT ST (SAFETY) ×4 IMPLANT
DRAPE CAMERA CLOSED 9X96 (DRAPES) ×4 IMPLANT
ELECT REM PT RETURN 9FT ADLT (ELECTROSURGICAL) ×4
ELECTRODE REM PT RTRN 9FT ADLT (ELECTROSURGICAL) ×2 IMPLANT
FIBER LASER FLEXIVA 200 (UROLOGICAL SUPPLIES) IMPLANT
FIBER LASER FLEXIVA 365 (UROLOGICAL SUPPLIES) IMPLANT
GLOVE BIOGEL M STRL SZ7.5 (GLOVE) ×4 IMPLANT
GOWN STRL REUS W/TWL LRG LVL3 (GOWN DISPOSABLE) ×4 IMPLANT
GUIDEWIRE ANG ZIPWIRE 038X150 (WIRE) IMPLANT
GUIDEWIRE STR DUAL SENSOR (WIRE) ×4 IMPLANT
IV NS IRRIG 3000ML ARTHROMATIC (IV SOLUTION) ×4 IMPLANT
PACK CYSTO (CUSTOM PROCEDURE TRAY) ×4 IMPLANT
SYRINGE 10CC LL (SYRINGE) IMPLANT
SYRINGE IRR TOOMEY STRL 70CC (SYRINGE) IMPLANT
TUBE FEEDING 8FR 16IN STR KANG (MISCELLANEOUS) ×4 IMPLANT

## 2014-12-02 NOTE — Anesthesia Preprocedure Evaluation (Signed)
Anesthesia Evaluation  Patient identified by MRN, date of birth, ID band Patient awake    Reviewed: Allergy & Precautions, H&P , NPO status , Patient's Chart, lab work & pertinent test results, reviewed documented beta blocker date and time   Airway Mallampati: III  TM Distance: >3 FB Neck ROM: full    Dental  (+) Missing, Dental Advisory Given,    Pulmonary former smoker,  Multiple places on CXR that look like mets, but they got worked up and are not mets. breath sounds clear to auscultation  Pulmonary exam normal       Cardiovascular Exercise Tolerance: Good hypertension, Pt. on home beta blockers and Pt. on medications Rhythm:regular Rate:Normal     Neuro/Psych negative neurological ROS  negative psych ROS   GI/Hepatic Neg liver ROS, GERD-  Medicated and Controlled,  Endo/Other  diabetes, Well Controlled, Type 2, Oral Hypoglycemic AgentsMorbid obesity  Renal/GU negative Renal ROS     Musculoskeletal   Abdominal   Peds  Hematology negative hematology ROS (+)   Anesthesia Other Findings   Reproductive/Obstetrics                             Anesthesia Physical  Anesthesia Plan  ASA: III  Anesthesia Plan: General   Post-op Pain Management:    Induction: Intravenous  Airway Management Planned: LMA  Additional Equipment:   Intra-op Plan:   Post-operative Plan: Extubation in OR  Informed Consent: I have reviewed the patients History and Physical, chart, labs and discussed the procedure including the risks, benefits and alternatives for the proposed anesthesia with the patient or authorized representative who has indicated his/her understanding and acceptance.   Dental advisory given  Plan Discussed with: CRNA  Anesthesia Plan Comments:         Anesthesia Quick Evaluation

## 2014-12-02 NOTE — Progress Notes (Signed)
C/O "gassy" pain. Rubbing hand across diaphragm/ chest Area. Denies SOB, Chest pain. Nausea relieved with Phenergan. Denies having this pain before. Bilateral breath sound clear. Dr. Lissa Hoard made aware.

## 2014-12-02 NOTE — Progress Notes (Signed)
Dr. Lissa Hoard in to see. 12 lead EKG unchanged. Pain unchanged in location and intensity. Denies radiation to arms or neck. Color pink, skin warm to touch. Will monitor. 61 Dr. Tresa Moore aware of symptoms and measures taken. No new orders at this time.

## 2014-12-02 NOTE — Discharge Instructions (Signed)
1 - You may have urinary urgency (bladder spasms) and bloody urine on / off x 1-2 weeks. This is normal.  2 - Call MD or go to ER for fever >102, severe pain / nausea / vomiting not relieved by medications, or acute change in medical status       General Anesthesia, Care After Refer to this sheet in the next few weeks. These instructions provide you with information on caring for yourself after your procedure. Your health care provider may also give you more specific instructions. Your treatment has been planned according to current medical practices, but problems sometimes occur. Call your health care provider if you have any problems or questions after your procedure. WHAT TO EXPECT AFTER THE PROCEDURE After the procedure, it is typical to experience:  Sleepiness.  Nausea and vomiting. HOME CARE INSTRUCTIONS  For the first 24 hours after general anesthesia:  Have a responsible person with you.  Do not drive a car. If you are alone, do not take public transportation.  Do not drink alcohol.  Do not take medicine that has not been prescribed by your health care provider.  Do not sign important papers or make important decisions.  You may resume a normal diet and activities as directed by your health care provider.  Change bandages (dressings) as directed.  If you have questions or problems that seem related to general anesthesia, call the hospital and ask for the anesthetist or anesthesiologist on call. SEEK MEDICAL CARE IF:  You have nausea and vomiting that continue the day after anesthesia.  You develop a rash. SEEK IMMEDIATE MEDICAL CARE IF:   You have difficulty breathing.  You have chest pain.  You have any allergic problems. Document Released: 03/13/2001 Document Revised: 12/10/2013 Document Reviewed: 06/20/2013 [facility] Patient Information 2015 [place], [place]. This information is not intended to replace advice given to you by your health care provider. Make  sure you discuss any questions you have with your health care provider.

## 2014-12-02 NOTE — Anesthesia Postprocedure Evaluation (Signed)
Anesthesia Post Note  Patient: Mario Reese  Procedure(s) Performed: Procedure(s) (LRB): CYSTOSCOPY WITH BIOPSY / FULGERATION (N/A) CYSTOSCOPY WITH RETROGRADE PYELOGRAM AND URETHRAL DILATION (Bilateral)  Anesthesia type: General  Patient location: PACU  Post pain: Pain level controlled  Post assessment: Post-op Vital signs reviewed  Last Vitals: BP 150/75 mmHg  Pulse 65  Temp(Src) 36.4 C (Oral)  Resp 16  Ht 6\' 2"  (1.88 m)  Wt 255 lb (115.667 kg)  BMI 32.73 kg/m2  SpO2 97%  Post vital signs: Reviewed  Level of consciousness: sedated  Complications: No apparent anesthesia complications

## 2014-12-02 NOTE — H&P (Signed)
Mario Reese is an 68 y.o. male.    Chief Complaint: Pre-Op Bladder Biopsy / Fulgeration, Retrograde Pyelograms  HPI:      1 - Bladder Cancer - Staging 2013 CT with large bilateral pulmonary nodules that are not hypermetabolic on PET-CT.  54 PY smoker, now quit.  Worked for 30 years in [employer] with on/off  chemical exposure.   Recent Treatment / Surveillance History: 10/24/2012 - TURBT / RPG - T1G3  --> developed DVT / PT placed on anticoagulation and not recieve re-staging TURBT  03/2013 - TURBT / RPG T1G3 multifocal with muscle in specimen--> Induction BCG 07/2013 - Surv cysto NED; 10/2013 cysto NED;  01/2014 cysto NED; 04/2014 cysto NED; 07/2014 cysto NED; 11/2014 dome and base erythema worrisome for possible CIS.  Today Mario Reese is seen to proceed with cysto and bladder biopsy for further evaluation of his ongoing hematuria and bladder erythema to rule our bladder cancer recurrence.   Past Medical History  Diagnosis Date  . Hypertension   . Hyperlipidemia   . Diabetes mellitus, type 2   . GERD (gastroesophageal reflux disease)   . Frequency of urination   . Urgency of urination   . Nocturia   . Bladder cancer   . Multiple pulmonary nodules BILATERAL -- NOT METS PER PET SCAN    Past Surgical History  Procedure Laterality Date  . Transurethral resection of bladder tumor  10/24/2012    Procedure: TRANSURETHRAL RESECTION OF BLADDER TUMOR (TURBT);  Surgeon: Alexis Frock, MD;  Location: [facility];  Service: Urology;  Laterality: N/A;  ATHY   . Cystoscopy with ureteroscopy  10/24/2012    Procedure: CYSTOSCOPY WITH URETEROSCOPY;  Surgeon: Alexis Frock, MD;  Location[facility];  Service: Urology;  Laterality: Bilateral;  retrograde and pyelogram  . Transurethral resection of bladder tumor N/A 04/24/2013    Procedure: TRANSURETHRAL RESECTION OF BLADDER TUMOR (TURBT);  Surgeon: Alexis Frock, MD;  Location: [facility];   Service: Urology;  Laterality: N/A;  . Cystoscopy w/ retrogrades Bilateral 04/24/2013    Procedure: CYSTOSCOPY WITH  Bilateral RETROGRADE PYELOGRAM;  Surgeon: Alexis Frock, MD;  Location: [facility];  Service: Urology;  Laterality: Bilateral;  . Tonsillectomy      No family history on file. Social History:  reports that he quit smoking about 3 years ago. His smoking use included Cigarettes. He has a 30 pack-year smoking history. He has never used smokeless tobacco. He reports that he drinks about 21.6 oz of alcohol per week. He reports that he does not use illicit drugs.  Allergies: No Known Allergies  Medications Prior to Admission  Medication Sig Dispense Refill  . amLODipine (NORVASC) 10 MG tablet Take 10 mg by mouth every morning.    Marland Kitchen aspirin EC 81 MG tablet Take 81 mg by mouth every morning.    . finasteride (PROSCAR) 5 MG tablet Take 5 mg by mouth every morning.    Marland Kitchen glimepiride (AMARYL) 2 MG tablet Take 2 mg by mouth daily with breakfast.    . lisinopril-hydrochlorothiazide (PRINZIDE,ZESTORETIC) 20-25 MG per tablet Take 1 tablet by mouth every morning. 27m/12.5mg hydrochlorthiazide    . metformin (FORTAMET) 500 MG (OSM) 24 hr tablet Take 2,000 mg by mouth daily with breakfast.    . metoprolol succinate (TOPROL-XL) 100 MG 24 hr tablet Take 100 mg by mouth every morning. Take with or immediately following a meal.    . MULTIPLE VITAMIN PO Take 1 tablet by mouth  every morning.    . Omega-3 Fatty Acids (FISH OIL) 1000 MG CAPS Take 2 capsules by mouth every morning.    Marland Kitchen omeprazole (PRILOSEC) 20 MG capsule Take 20 mg by mouth every morning.    . pravastatin (PRAVACHOL) 40 MG tablet Take 40 mg by mouth every morning.    . vitamin B-12 (CYANOCOBALAMIN) 1000 MCG tablet Take 1,000 mcg by mouth every morning.    Marland Kitchen oxyCODONE-acetaminophen (ROXICET) 5-325 MG per tablet Take 1 tablet by mouth every 4 (four) hours as needed for pain. (Patient not taking: Reported on 11/27/2014) 30  tablet 0  . sennosides-docusate sodium (SENOKOT-S) 8.6-50 MG tablet Take 1 tablet by mouth 2 (two) times daily. (Patient not taking: Reported on 11/27/2014) 30 tablet 0  . sulfamethoxazole-trimethoprim (BACTRIM) 400-80 MG per tablet Take 1 tablet by mouth 2 (two) times daily. X 3 days to prevent infection (Patient not taking: Reported on 11/27/2014) 6 tablet 0    Results for orders placed or performed during the hospital encounter of 12/01/14 (from the past 48 hour(s))  Basic metabolic panel     Status: Abnormal   Collection Time: 12/01/14  9:30 AM  Result Value Ref Range   Sodium 141 137 - 147 mEq/L   Potassium 4.0 3.7 - 5.3 mEq/L   Chloride 101 96 - 112 mEq/L   CO2 28 19 - 32 mEq/L   Glucose, Bld 144 (H) 70 - 99 mg/dL   BUN 16 6 - 23 mg/dL   Creatinine, Ser 0.74 0.50 - 1.35 mg/dL   Calcium 9.7 8.4 - 10.5 mg/dL   GFR calc non Af Amer >90 >90 mL/min   GFR calc Af Amer >90 >90 mL/min    Comment: (NOTE) The eGFR has been calculated using the CKD EPI equation. This calculation has not been validated in all clinical situations. eGFR's persistently <90 mL/min signify possible Chronic Kidney Disease.    Anion gap 12 5 - 15  CBC with Differential     Status: Abnormal   Collection Time: 12/01/14  9:30 AM  Result Value Ref Range   WBC 6.1 4.0 - 10.5 K/uL   RBC 4.85 4.22 - 5.81 MIL/uL   Hemoglobin 16.6 13.0 - 17.0 g/dL   HCT 46.4 39.0 - 52.0 %   MCV 95.7 78.0 - 100.0 fL   MCH 34.2 (H) 26.0 - 34.0 pg   MCHC 35.8 30.0 - 36.0 g/dL   RDW 12.2 11.5 - 15.5 %   Platelets 123 (L) 150 - 400 K/uL   Neutrophils Relative % 63 43 - 77 %   Neutro Abs 3.9 1.7 - 7.7 K/uL   Lymphocytes Relative 21 12 - 46 %   Lymphs Abs 1.3 0.7 - 4.0 K/uL   Monocytes Relative 11 3 - 12 %   Monocytes Absolute 0.7 0.1 - 1.0 K/uL   Eosinophils Relative 4 0 - 5 %   Eosinophils Absolute 0.2 0.0 - 0.7 K/uL   Basophils Relative 1 0 - 1 %   Basophils Absolute 0.0 0.0 - 0.1 K/uL   Dg Chest 2 View  12/01/2014    CLINICAL DATA:  History bladder cancer. History pulmonary nodules. Preoperative chest x-ray.  EXAM: CHEST  2 VIEW  COMPARISON:  PET-CT 10/30/2012, CT chest 10/24/2012, chest x-ray 10/24/2012.  FINDINGS: Mediastinum and hilar structures stable. Heart size stable. Previously identified prominent pulmonary nodules again noted. These appear similar from prior study 2013. No new nodules identified. No pleural effusion or pneumothorax. No acute bony abnormality. Degenerative changes  thoracic spine.  IMPRESSION: Prominent bilateral pulmonary nodules appear stable and unchanged from prior studies dated to 10/24/2012. No acute cardiopulmonary disease.   Electronically Signed   By: Marcello Moores  Register   On: 12/01/2014 10:00    Review of Systems  Constitutional: Negative.   HENT: Negative.   Eyes: Negative.   Respiratory: Negative.   Cardiovascular: Negative.   Gastrointestinal: Negative.  Negative for nausea and vomiting.  Genitourinary: Positive for hematuria. Negative for flank pain.  Musculoskeletal: Negative.   Skin: Negative.   Neurological: Negative.   Endo/Heme/Allergies: Negative.   Psychiatric/Behavioral: Negative.     There were no vitals taken for this visit. Physical Exam  Constitutional: He appears well-developed.  HENT:  Head: Normocephalic.  Eyes: Pupils are equal, round, and reactive to light.  Neck: Normal range of motion.  Cardiovascular: Normal rate.   Respiratory: Effort normal.  GI: Soft.  Genitourinary: Penis normal.  No CVAT  Musculoskeletal: Normal range of motion.  Neurological: He is alert.  Skin: Skin is warm and dry.  Psychiatric: He has a normal mood and affect. His behavior is normal. Judgment and thought content normal.     Assessment/Plan   1 - Bladder Cancer -  New bladder erythematous changes worrisome for possible carcinoma in situ. Given his on/ off recent gross hematuria I feel this warrants cysto / retrogrades / biopsy / fulguration in OR.   We  rediscussed operative biopsy / transurethral resection as the best next step for diagnostic and therapeutic purposes with goals being to remove all visible cancer and obtain tissue for pathologic exam. We rediscussed that for some low-grade tumors, this may be all the treatment required, but that for many other tumors such as high-grade lesions, further therapy including surgery and or chemotherapy may be warranted. We alsore outlined the fact that any bladder cancer diagnosis will require close follow-up with periodic upper and lower tract evaluation. We rediscussed risks including bleeding, infection, damage to kidney / ureter / bladder including bladder perforation which can typically managed with prolonged foley catheterization. We rementioned anesthetic and other rare risks including DVT, PE, MI, and mortality. I also rementioned that adjunctive procedures such as ureteral stenting, retrograde pyelography, and ureteroscopy may be necessary to fully evaluate the urinary tract depending on intra-operative findings.   After answering all questions to the patient's satisfaction, they wish to proceed.  Latorsha Curling 12/02/2014, 5:36 AM

## 2014-12-02 NOTE — Transfer of Care (Signed)
Immediate Anesthesia Transfer of Care Note  Patient: Mario Reese  Procedure(s) Performed: Procedure(s): CYSTOSCOPY WITH BIOPSY / FULGERATION (N/A) CYSTOSCOPY WITH RETROGRADE PYELOGRAM AND URETHRAL DILATION (Bilateral)  Patient Location: PACU  Anesthesia Type:General  Level of Consciousness: awake, alert  and oriented  Airway & Oxygen Therapy: Patient Spontanous Breathing and Patient connected to face mask oxygen  Post-op Assessment: Report given to PACU RN and Post -op Vital signs reviewed and stable  Post vital signs: Reviewed and stable  Complications: No apparent anesthesia complications

## 2014-12-02 NOTE — Op Note (Signed)
NAME:  Mario Reese NO.:  [id number]  MEDICAL RECORD NO.:  [id number]  LOCATION:  [facility]                         FACILITY:  [facility]  PHYSICIAN:  Alexis Frock, MD     DATE OF BIRTH:  [birth year]  DATE OF PROCEDURE:  12/02/2014 DATE OF DISCHARGE:                              OPERATIVE REPORT   PREOPERATIVE DIAGNOSES:  History of bladder cancer, bladder erythema with recurrent gross hematuria.  POSTOPERATIVE DIAGNOSES:  History of bladder cancer, bladder erythema with recurrent gross hematuria plus urethral stricture.  PROCEDURE: 1. Cystoscopy with bilateral retrograde pyelogram and interpretation. 2. Bladder biopsy fulguration. 3. Urethral dilation with male sounds.  FINDINGS: 1. Small erythematous patch posterior bladder wall.  Total surface     area approximately 1.5 cm. Also erythema in dome, total surface area approximately     1 cm.  These areas were biopsied and fulgurated without evidence of     bladder perforation. 2. Dense distal pendulous urethral stricture approximately 14-French     pre-dilation, 30-French post dilation.  ESTIMATED BLOOD LOSS:  Nil.  COMPLICATIONS:  None.  SPECIMEN:  Bladder dome and posterior bladder wall erythema, tissue fragments to permanent pathology.  DRAINS:  None.  INDICATION:  Mario Reese is a very pleasant 68 year old gentleman with history of high-grade superficial bladder cancer status post prior resections and induction of BCG.  He has had some recurrent gross hematuria and was found on office cystoscopy to have some erythematous patches in the bladder worrisome for possible local recurrence.  He also had some progressive spilling of the urinary stream, slightly distal pendulous stricture without frank retention. Options were discussed including operative biopsy, fulguration, and he wished to proceed.  Informed consent was obtained and placed in the medical record.  PROCEDURE IN DETAIL:  The patient being  Mario Reese was verified. Procedure being cystoscopy, biopsy, and fulguration was confirmed. Procedure was carried out.  Time-out was performed.  Intravenous antibiotics were administered.  General LMA anesthesia was introduced. The patient was placed into a low lithotomy position.  Sterile field was created by prepping and draping the patient's penis, perineum, and proximal thighs using iodine x3.  Next, cystourethroscopy was attempted using 22-French rigid cystoscope with 12-degree offset lens in the distal pendulous urethra.  Just proximal to the fossa navicularis, there was dense pendulous stricture clearly necessitated dilation to allow successful procedure today and male sounds were sequentially used of the pendulous urethra beginning at 14-French up to 30-French in 2-French increments, which easily calibrated the urethra to 30-French.  This allowed easy passage of the 22-French cystoscope, 30-degree offset lens. Inspection of the anterior and posterior urethra unremarkable. Inspection of the urinary bladder revealed no diverticula, calcifications, or papular lesions.  There were several erythematous patches as noted previously mostly in the posterior and bladder dome areas.  Cold cup biopsy forceps were used to first biopsy the posterior erythema and 2 small cold cup biopsies were set aside for permanent pathology.  These areas were fulgurated.  There was no evidence of perforation.  Fulguration was performed using Bugbee electrode.  Notably irrigation was sterile water.  The dome lesion was also biopsied with Bugbee electrode and fulgurated.  No evidence of perforation was noted. Next, retrograde pyelography was performed.  First the right ureteral orifice was cannulated with 6-French end-hole catheter and right retrograde pyelogram was obtained.  Right retrograde pyelogram demonstrates a single right ureter, single system right kidney.  No filling defects or narrowing noted.   Similarly left retrograde pyelogram was obtained.  Left retrograde pyelogram demonstrated single left ureter, single system left kidney.  No filling defects or narrowing noted.  Bladder was emptied per cystoscope.  The urethra was inspected on withdrawing of the scope and no significant abnormalities were found.  It was felt that there was no need for catheterization.  Hemostasis of the urethral dilation site appeared excellent.  Procedure was then terminated.  The patient tolerated the procedure well with no immediate periprocedural complications.  The patient was taken to the Postanesthesia Care Unit in stable condition.          ______________________________ Alexis Frock, MD     TM/MEDQ  D:  12/02/2014  T:  12/02/2014  Job:  [id number]

## 2014-12-02 NOTE — Brief Op Note (Signed)
12/02/2014  8:06 AM  PATIENT:  Mario Reese  68 y.o. male  PRE-OPERATIVE DIAGNOSIS:  HEMATURIA, BLADDER ERYTHEMA  POST-OPERATIVE DIAGNOSIS:  HEMATURIA, BLADDER ERYTHEMA, Urethral Stricture  PROCEDURE:  Procedure(s): CYSTOSCOPY WITH BIOPSY / FULGERATION (N/A) CYSTOSCOPY WITH RETROGRADE PYELOGRAM AND URETHRAL DILATION (Bilateral)  SURGEON:  Surgeon(s) and Role:    * Junious Dresser, MD - Primary  PHYSICIAN ASSISTANT:   ASSISTANTS: none   ANESTHESIA:   general  EBL:     BLOOD ADMINISTERED:none  DRAINS: none   LOCAL MEDICATIONS USED:  NONE  SPECIMEN:  Source of Specimen:  1 - posterior bladder erythema, 2 - bladder dome erythema  DISPOSITION OF SPECIMEN:  PATHOLOGY  COUNTS:  YES  TOURNIQUET:  * No tourniquets in log *  DICTATION: .Other Dictation: Dictation Number [id number]  PLAN OF CARE: Discharge to home after PACU  PATIENT DISPOSITION:  PACU - hemodynamically stable.   Delay start of Pharmacological VTE agent (>24hrs) due to surgical blood loss or risk of bleeding: not applicable

## 2014-12-03 ENCOUNTER — Encounter (HOSPITAL_COMMUNITY): Payer: Self-pay | Admitting: Urology

## 2014-12-10 ENCOUNTER — Other Ambulatory Visit: Payer: Self-pay | Admitting: Urology

## 2014-12-15 ENCOUNTER — Inpatient Hospital Stay (HOSPITAL_COMMUNITY): Admission: RE | Admit: 2014-12-15 | Payer: Medicare Other | Source: Ambulatory Visit | Admitting: Urology

## 2015-01-15 ENCOUNTER — Inpatient Hospital Stay (HOSPITAL_COMMUNITY)
Admission: AD | Admit: 2015-01-15 | Discharge: 2015-01-22 | DRG: 654 | Disposition: A | Discharge status: Home Health | Payer: Medicare Other | Source: Ambulatory Visit | Attending: Urology | Admitting: Urology

## 2015-01-15 ENCOUNTER — Inpatient Hospital Stay: Admission: AD | Admit: 2015-01-15 | Payer: Medicare Other | Source: Ambulatory Visit | Admitting: Urology

## 2015-01-15 ENCOUNTER — Encounter (HOSPITAL_COMMUNITY): Payer: Self-pay

## 2015-01-15 DIAGNOSIS — K219 Gastro-esophageal reflux disease without esophagitis: Secondary | ICD-10-CM | POA: Diagnosis present

## 2015-01-15 DIAGNOSIS — R918 Other nonspecific abnormal finding of lung field: Secondary | ICD-10-CM | POA: Diagnosis present

## 2015-01-15 DIAGNOSIS — Z7982 Long term (current) use of aspirin: Secondary | ICD-10-CM | POA: Diagnosis not present

## 2015-01-15 DIAGNOSIS — E785 Hyperlipidemia, unspecified: Secondary | ICD-10-CM | POA: Diagnosis present

## 2015-01-15 DIAGNOSIS — C679 Malignant neoplasm of bladder, unspecified: Principal | ICD-10-CM | POA: Diagnosis present

## 2015-01-15 DIAGNOSIS — Z79899 Other long term (current) drug therapy: Secondary | ICD-10-CM | POA: Diagnosis not present

## 2015-01-15 DIAGNOSIS — E119 Type 2 diabetes mellitus without complications: Secondary | ICD-10-CM | POA: Diagnosis present

## 2015-01-15 DIAGNOSIS — E669 Obesity, unspecified: Secondary | ICD-10-CM | POA: Diagnosis present

## 2015-01-15 DIAGNOSIS — K913 Postprocedural intestinal obstruction: Secondary | ICD-10-CM | POA: Diagnosis not present

## 2015-01-15 DIAGNOSIS — C680 Malignant neoplasm of urethra: Secondary | ICD-10-CM | POA: Diagnosis present

## 2015-01-15 DIAGNOSIS — I10 Essential (primary) hypertension: Secondary | ICD-10-CM | POA: Diagnosis present

## 2015-01-15 DIAGNOSIS — Z6832 Body mass index (BMI) 32.0-32.9, adult: Secondary | ICD-10-CM

## 2015-01-15 DIAGNOSIS — Z87891 Personal history of nicotine dependence: Secondary | ICD-10-CM | POA: Diagnosis not present

## 2015-01-15 LAB — COMPREHENSIVE METABOLIC PANEL
ALT: 18 U/L (ref 0–53)
AST: 19 U/L (ref 0–37)
Albumin: 4 g/dL (ref 3.5–5.2)
Alkaline Phosphatase: 51 U/L (ref 39–117)
Anion gap: 7 (ref 5–15)
BILIRUBIN TOTAL: 0.7 mg/dL (ref 0.3–1.2)
BUN: 13 mg/dL (ref 6–23)
CO2: 32 mmol/L (ref 19–32)
Calcium: 9 mg/dL (ref 8.4–10.5)
Chloride: 102 mmol/L (ref 96–112)
Creatinine, Ser: 0.81 mg/dL (ref 0.50–1.35)
GFR calc Af Amer: >90 mL/min (ref >90)
GFR calc non Af Amer: 89 mL/min — ABNORMAL LOW (ref >90)
Glucose, Bld: 145 mg/dL — ABNORMAL HIGH (ref 70–99)
Potassium: 3.6 mmol/L (ref 3.5–5.1)
Sodium: 141 mmol/L (ref 135–145)
Total Protein: 6.5 g/dL (ref 6.0–8.3)

## 2015-01-15 LAB — GLUCOSE, CAPILLARY
GLUCOSE-CAPILLARY: 106 mg/dL — AB (ref 70–99)
GLUCOSE-CAPILLARY: 121 mg/dL — AB (ref 70–99)

## 2015-01-15 LAB — CBC
HEMATOCRIT: 44.4 % (ref 39.0–52.0)
Hemoglobin: 16.2 g/dL (ref 13.0–17.0)
MCH: 35.1 pg — ABNORMAL HIGH (ref 26.0–34.0)
MCHC: 36.5 g/dL — ABNORMAL HIGH (ref 30.0–36.0)
MCV: 96.1 fL (ref 78.0–100.0)
PLATELETS: 128 10*3/uL — AB (ref 150–400)
RBC: 4.62 MIL/uL (ref 4.22–5.81)
RDW: 12.4 % (ref 11.5–15.5)
WBC: 6.4 10*3/uL (ref 4.0–10.5)

## 2015-01-15 MED ORDER — PEG 3350-KCL-NA BICARB-NACL 420 G PO SOLR
4000.0000 mL | Freq: Once | ORAL | Status: AC
  Administered 2015-01-15: 4000 mL via ORAL

## 2015-01-15 MED ORDER — PANTOPRAZOLE SODIUM 40 MG PO TBEC
40.0000 mg | DELAYED_RELEASE_TABLET | Freq: Every day | ORAL | Status: DC
  Filled 2015-01-15: qty 1

## 2015-01-15 MED ORDER — ALVIMOPAN 12 MG PO CAPS
12.0000 mg | ORAL_CAPSULE | ORAL | Status: DC
  Filled 2015-01-15: qty 1

## 2015-01-15 MED ORDER — PRAVASTATIN SODIUM 40 MG PO TABS
40.0000 mg | ORAL_TABLET | Freq: Every morning | ORAL | Status: DC
  Administered 2015-01-17 – 2015-01-22 (×6): 40 mg via ORAL
  Filled 2015-01-15: qty 1
  Filled 2015-01-15: qty 2
  Filled 2015-01-15 (×2): qty 1
  Filled 2015-01-15 (×2): qty 2
  Filled 2015-01-15: qty 1

## 2015-01-15 MED ORDER — AMLODIPINE BESYLATE 10 MG PO TABS
10.0000 mg | ORAL_TABLET | Freq: Every morning | ORAL | Status: DC
  Filled 2015-01-15: qty 1

## 2015-01-15 MED ORDER — KCL IN DEXTROSE-NACL 20-5-0.45 MEQ/L-%-% IV SOLN
INTRAVENOUS | Status: DC
  Administered 2015-01-15: 16:00:00 via INTRAVENOUS
  Filled 2015-01-15 (×3): qty 1000

## 2015-01-15 MED ORDER — PIPERACILLIN-TAZOBACTAM 3.375 G IVPB 30 MIN
3.3750 g | INTRAVENOUS | Status: AC
  Administered 2015-01-16 (×3): 3.375 g via INTRAVENOUS
  Filled 2015-01-15: qty 50

## 2015-01-15 MED ORDER — METOPROLOL SUCCINATE ER 100 MG PO TB24
200.0000 mg | ORAL_TABLET | Freq: Every day | ORAL | Status: DC
  Administered 2015-01-17 – 2015-01-22 (×6): 200 mg via ORAL
  Filled 2015-01-15 (×6): qty 2
  Filled 2015-01-15: qty 8

## 2015-01-15 MED ORDER — INSULIN ASPART 100 UNIT/ML ~~LOC~~ SOLN: 0.0000 [IU] | Freq: Three times a day (TID) | SUBCUTANEOUS | Status: DC

## 2015-01-15 NOTE — Consult Note (Signed)
WOC ostomy consult note Patient seen per Dr. Zettie Pho request for presurgical stoma site selection.  Dr. Tresa Moore in to visit patient and significant other during my visit today. Patient's abdomen assessed in the sitting and standing positions. Patient expresses a desire to have site located below his belt line and he is wearing shorts to illustrate where that line is.  He also acknowledges that occasionally he wears his slacks and undergarments below his abdomen.  Site selected is 7.5cm to the right of the umbilicus and 2cm above in the RUQ.  Patient can see the mark when he uses his nondominant hand to lift the skin above the stoma.  The mark is made with a surgical site marking pen and is covered with a thin film transparent dressing. Education provided: Patient and girlfriend are eager to learn and to have the bleeding and urinary frequency associated with his bladder cancer behind them.  Asking appropriate questions.  They have spoken with a patient who had a urostomy approximately 5 years ago and have received some good and some inaccurate information.  Patient and his significant other (a retired Marine scientist) understand the role of the [facility] Nurse in the post operative and post acute phase and are comforted in this, the day prior to surgery that they have come to the right place for the necessary operation. I look forward to assisting you in the care of this nice gentleman.  The [facility] team will follow along with you. Thanks, Maudie Flakes, MSN, RN, [place], [place], [place] [phone])

## 2015-01-15 NOTE — Anesthesia Preprocedure Evaluation (Signed)
Anesthesia Evaluation  Patient identified by MRN, date of birth, ID band Patient awake    Reviewed: Allergy & Precautions, H&P , NPO status , Patient's Chart, lab work & pertinent test results, reviewed documented beta blocker date and time   Airway Mallampati: III  TM Distance: >3 FB Neck ROM: full    Dental  (+) Missing, Dental Advisory Given,    Pulmonary former smoker,  Multiple places on CXR that look like mets, but they got worked up and are not mets. breath sounds clear to auscultation  Pulmonary exam normal       Cardiovascular Exercise Tolerance: Good hypertension, Pt. on home beta blockers and Pt. on medications Rhythm:regular Rate:Normal     Neuro/Psych negative neurological ROS  negative psych ROS   GI/Hepatic Neg liver ROS, GERD-  Medicated and Controlled,  Endo/Other  diabetes, Well Controlled, Type 2, Oral Hypoglycemic AgentsMorbid obesity  Renal/GU negative Renal ROS     Musculoskeletal   Abdominal   Peds  Hematology negative hematology ROS (+)   Anesthesia Other Findings   Reproductive/Obstetrics                             Anesthesia Physical Anesthesia Plan  ASA: III  Anesthesia Plan: General   Post-op Pain Management:    Induction: Intravenous  Airway Management Planned: Oral ETT  Additional Equipment:   Intra-op Plan:   Post-operative Plan: Extubation in OR  Informed Consent:   Plan Discussed with: Surgeon  Anesthesia Plan Comments:         Anesthesia Quick Evaluation

## 2015-01-15 NOTE — H&P (Signed)
Mario Reese is an 69 y.o. male.    Chief Complaint: Pre-op Cystoprostatectomy  HPI:        1 - Recurrent Bladder Cancer - Staging 2013 CT with large bilateral pulmonary nodules that are not hypermetabolic on PET-CT and stable by CXR 2016.  30 PY smoker, now quit.  Worked for 30 years in [employer] with on/off  chemical exposure.   Recent Treatment / Surveillance History: 10/24/2012 - TURBT / RPG - T1G3  --> developed DVT / PT placed on anticoagulation and not recieve re-staging TURBT  03/2013 - TURBT / RPG T1G3 multifocal with muscle in specimen--> Induction BCG 07/2013 - Surv cysto NED; 10/2013 cysto NED;  01/2014 cysto NED; 04/2014 cysto NED; 07/2014 cysto NED; 11/2014 dome and base erythema. 11/2014 - TURBT - T2G3 by TURBT, CT Urogram - no bulky disease  PMH sig for Obesity, DM2. No CV disease. DVT (now off blood thinners). He has been loosing weight agressively on purpose and down nearly 50 lbs!  Today Mario Reese is seen as preadmission before cystoprostatectomy tomorrow.  Past Medical History  Diagnosis Date  . Hypertension   . Hyperlipidemia   . Diabetes mellitus, type 2   . GERD (gastroesophageal reflux disease)   . Frequency of urination   . Urgency of urination   . Nocturia   . Bladder cancer   . Multiple pulmonary nodules BILATERAL -- NOT METS PER PET SCAN    Past Surgical History  Procedure Laterality Date  . Transurethral resection of bladder tumor  10/24/2012    Procedure: TRANSURETHRAL RESECTION OF BLADDER TUMOR (TURBT);  Surgeon: Alexis Frock, MD;  Location: [facility];  Service: Urology;  Laterality: N/A;  ATHY   . Cystoscopy with ureteroscopy  10/24/2012    Procedure: CYSTOSCOPY WITH URETEROSCOPY;  Surgeon: Alexis Frock, MD;  Location: [facility];  Service: Urology;  Laterality: Bilateral;  retrograde and pyelogram  . Transurethral resection of bladder tumor N/A 04/24/2013    Procedure: TRANSURETHRAL RESECTION OF  BLADDER TUMOR (TURBT);  Surgeon: Alexis Frock, MD;  Location: [facility];  Service: Urology;  Laterality: N/A;  . Cystoscopy w/ retrogrades Bilateral 04/24/2013    Procedure: CYSTOSCOPY WITH  Bilateral RETROGRADE PYELOGRAM;  Surgeon: Alexis Frock, MD;  Location: [facility];  Service: Urology;  Laterality: Bilateral;  . Tonsillectomy    . Cystoscopy with biopsy N/A 12/02/2014    Procedure: CYSTOSCOPY WITH BIOPSY / Almyra Free;  Surgeon: Junious Dresser, MD;  Location: [facility];  Service: Urology;  Laterality: N/A;  . Cystoscopy w/ retrogrades Bilateral 12/02/2014    Procedure: CYSTOSCOPY WITH RETROGRADE PYELOGRAM AND URETHRAL DILATION;  Surgeon: Junious Dresser, MD;  Location: [facility];  Service: Urology;  Laterality: Bilateral;    History reviewed. No pertinent family history. Social History:  reports that he quit smoking about 3 years ago. His smoking use included Cigarettes. He has a 30 pack-year smoking history. He has never used smokeless tobacco. He reports that he drinks about 21.6 oz of alcohol per week. He reports that he does not use illicit drugs.  Allergies: No Known Allergies  Medications Prior to Admission  Medication Sig Dispense Refill  . amLODipine (NORVASC) 10 MG tablet Take 10 mg by mouth every morning.    Marland Kitchen aspirin EC 81 MG tablet Take 81 mg by mouth every morning.    . finasteride (PROSCAR) 5 MG tablet Take 5 mg by mouth every morning.    Marland Kitchen glimepiride (  AMARYL) 2 MG tablet Take 2 mg by mouth daily with breakfast.    . ibuprofen (ADVIL,MOTRIN) 200 MG tablet Take 400 mg by mouth daily as needed for fever, headache, moderate pain or cramping. Takes every morning    . lisinopril-hydrochlorothiazide (PRINZIDE,ZESTORETIC) 20-12.5 MG per tablet Take 2 tablets by mouth daily with breakfast.    . metformin (FORTAMET) 500 MG (OSM) 24 hr tablet Take 2,000 mg by mouth daily with breakfast.    . metoprolol (TOPROL-XL) 200 MG 24 hr tablet Take 200  mg by mouth daily with breakfast.    . Multiple Vitamin (MULTIVITAMIN WITH MINERALS) TABS tablet Take 1 tablet by mouth daily.    . Omega-3 Fatty Acids (FISH OIL) 1000 MG CAPS Take 2 capsules by mouth every morning.    Marland Kitchen omeprazole (PRILOSEC) 20 MG capsule Take 20 mg by mouth every morning.    . pravastatin (PRAVACHOL) 40 MG tablet Take 40 mg by mouth every morning.    . vitamin B-12 (CYANOCOBALAMIN) 1000 MCG tablet Take 1,000 mcg by mouth every morning.    Marland Kitchen oxyCODONE-acetaminophen (ROXICET) 5-325 MG per tablet Take 1 tablet by mouth every 4 (four) hours as needed for moderate pain or severe pain. Post-operatively (Patient not taking: Reported on 01/15/2015) 20 tablet 0  . sennosides-docusate sodium (SENOKOT-S) 8.6-50 MG tablet Take 1 tablet by mouth 2 (two) times daily. While taking pain meds to prevent constipation (Patient not taking: Reported on 01/15/2015) 30 tablet 0  . sulfamethoxazole-trimethoprim (BACTRIM) 400-80 MG per tablet Take 1 tablet by mouth 2 (two) times daily. X 3 days to prevent infection (Patient not taking: Reported on 11/27/2014) 6 tablet 0    No results found for this or any previous visit (from the past 48 hour(s)). No results found.  Review of Systems  Constitutional: Negative.  Negative for fever and chills.  HENT: Negative.   Eyes: Negative.   Respiratory: Negative.   Cardiovascular: Negative.   Gastrointestinal: Negative.  Negative for nausea and vomiting.  Genitourinary: Positive for hematuria.  Musculoskeletal: Negative.   Skin: Negative.   Neurological: Negative.   Endo/Heme/Allergies: Negative.   Psychiatric/Behavioral: Negative.     Blood pressure 144/76, pulse 64, temperature 97.5 F (36.4 C), temperature source Oral, resp. rate 16, height 6\' 2"  (1.88 m), weight 114.306 kg (252 lb), SpO2 96 %. Physical Exam  Constitutional: He appears well-developed and well-nourished.  HENT:  Head: Normocephalic.  Eyes: Pupils are equal, round, and reactive to  light.  Neck: Normal range of motion.  Cardiovascular: Normal rate.   Respiratory: Effort normal.  GI: Soft.  Moderate truncal obesity  Genitourinary: Penis normal.  No CVAT  Musculoskeletal: Normal range of motion.  Neurological: He is alert.  Skin: Skin is warm.  Psychiatric: He has a normal mood and affect. His behavior is normal. Judgment and thought content normal.     Assessment/Plan   1 - Bladder Cancer -  now recurrent and muscle invasive. We have discussed cystectomy in detail previously and he wants to proceed.   We rediscussed the role of radical cystectomy + lymph node dissection with concomitant prostatectomy in male and hysterectomy / oophorectomy in male and ileal conduit urinary diversion with the overall goal of complete surgical excision (negative margins) and better staging / diagnosis. We specifically rediscussed alternatives including chemo-radiation, palliative therapies, and the role of neoadjuvant chemotherapy. We then rediscussed surgical approaches including robotic and open techniques with robotic associated with a shorter convalescence. I showed the patient on their abdomen the  approximately 4-6 incision (trocar) sites as well as presumed extraction sites with robotic approach as well as possible open incision sites. I also showed them potential sites for the ileal conduit and spent significant time explaining the "plumbing" of this with regards to GI and GU tracts and specific risks of diversion including ureteral stricture. We specifically readdressed that there may be need to alter operative plans according to intraopertive findings including conversion to open procedure. We rediscussed specific peri-operative risks including bleeding, infection, deep vein thrombosis, pulmonary embolism, compartment syndrome, nuropathy / neuropraxia, bowel leak, bowel stricture, heart attack, stroke, death, as well as long-term risks such as non-cure / need for additional therapy  and need for imaging and lab based post-op surveillance protocols. We rediscussed typical hospital course of approximately 5-7 day hospitalization, need for peri-operative drains / catheters, and typical post-hospital course with return to most non-strenuous activities by 4 weeks and ability to return to most jobs and more strenuous activity such as exercise by 8 weeks but with complete return to baseline often taking 39mos plus.   After this lengthy and detail discussion, including answering all of the patient's questions to their satisfaction, they have chosen to proceed with robotic cystecotmy as planned tomorrow.   CMP, CBC, Bowel Prep, IVF, Stomal RN consult    Mario Reese 01/15/2015, 1:17 PM

## 2015-01-15 NOTE — Progress Notes (Signed)
Pt reporting completely clear stools. Verified with RN

## 2015-01-16 ENCOUNTER — Inpatient Hospital Stay (HOSPITAL_COMMUNITY): Payer: Medicare Other | Admitting: Anesthesiology

## 2015-01-16 ENCOUNTER — Encounter (HOSPITAL_COMMUNITY)
Admission: AD | Disposition: A | Discharge status: Home Health | Payer: Self-pay | Source: Ambulatory Visit | Attending: Urology

## 2015-01-16 DIAGNOSIS — C679 Malignant neoplasm of bladder, unspecified: Secondary | ICD-10-CM | POA: Diagnosis present

## 2015-01-16 HISTORY — PX: ROBOT ASSISTED LAPAROSCOPIC COMPLETE CYSTECT ILEAL CONDUIT: SHX5139

## 2015-01-16 HISTORY — PX: CYSTOSCOPY: SHX5120

## 2015-01-16 LAB — BASIC METABOLIC PANEL
ANION GAP: 12 (ref 5–15)
BUN: 11 mg/dL (ref 6–23)
CO2: 26 mmol/L (ref 19–32)
CREATININE: 1.04 mg/dL (ref 0.50–1.35)
Calcium: 8.2 mg/dL — ABNORMAL LOW (ref 8.4–10.5)
Chloride: 102 mmol/L (ref 96–112)
GFR calc Af Amer: 83 mL/min — ABNORMAL LOW (ref >90)
GFR, EST NON AFRICAN AMERICAN: 71 mL/min — AB (ref >90)
Glucose, Bld: 219 mg/dL — ABNORMAL HIGH (ref 70–99)
POTASSIUM: 3.7 mmol/L (ref 3.5–5.1)
Sodium: 140 mmol/L (ref 135–145)

## 2015-01-16 LAB — TYPE AND SCREEN
ABO/RH(D): B POS
Antibody Screen: NEGATIVE

## 2015-01-16 LAB — SURGICAL PCR SCREEN
MRSA, PCR: NEGATIVE
Staphylococcus aureus: NEGATIVE

## 2015-01-16 LAB — GLUCOSE, CAPILLARY
GLUCOSE-CAPILLARY: 192 mg/dL — AB (ref 70–99)
Glucose-Capillary: 275 mg/dL — ABNORMAL HIGH (ref 70–99)

## 2015-01-16 LAB — HEMOGLOBIN AND HEMATOCRIT, BLOOD
HEMATOCRIT: 45.6 % (ref 39.0–52.0)
Hemoglobin: 16.2 g/dL (ref 13.0–17.0)

## 2015-01-16 LAB — ABO/RH: ABO/RH(D): B POS

## 2015-01-16 SURGERY — CYSTECTOMY, ROBOT-ASSISTED, WITH ILEAL CONDUIT CREATION
Anesthesia: General

## 2015-01-16 MED ORDER — NALOXONE HCL 0.4 MG/ML IJ SOLN: 0.4000 mg | INTRAMUSCULAR | Status: DC | PRN

## 2015-01-16 MED ORDER — ONDANSETRON HCL 4 MG/2ML IJ SOLN
4.0000 mg | INTRAMUSCULAR | Status: DC | PRN
  Administered 2015-01-16: 4 mg via INTRAVENOUS
  Filled 2015-01-16: qty 2

## 2015-01-16 MED ORDER — LACTATED RINGERS IV SOLN
INTRAVENOUS | Status: DC | PRN
  Administered 2015-01-16 (×3): via INTRAVENOUS

## 2015-01-16 MED ORDER — HYDRALAZINE HCL 20 MG/ML IJ SOLN
INTRAMUSCULAR | Status: AC
  Filled 2015-01-16: qty 1

## 2015-01-16 MED ORDER — ALBUTEROL SULFATE HFA 108 (90 BASE) MCG/ACT IN AERS
INHALATION_SPRAY | RESPIRATORY_TRACT | Status: DC | PRN
  Administered 2015-01-16: 5 via RESPIRATORY_TRACT

## 2015-01-16 MED ORDER — NEOSTIGMINE METHYLSULFATE 10 MG/10ML IV SOLN
INTRAVENOUS | Status: DC | PRN
  Administered 2015-01-16: 4 mg via INTRAVENOUS

## 2015-01-16 MED ORDER — PROPOFOL 10 MG/ML IV BOLUS
INTRAVENOUS | Status: AC
  Filled 2015-01-16: qty 20

## 2015-01-16 MED ORDER — HYDRALAZINE HCL 20 MG/ML IJ SOLN
INTRAMUSCULAR | Status: DC | PRN
  Administered 2015-01-16 (×2): 2.5 mg via INTRAVENOUS

## 2015-01-16 MED ORDER — SODIUM CHLORIDE 0.9 % IJ SOLN: 9.0000 mL | INTRAMUSCULAR | Status: DC | PRN

## 2015-01-16 MED ORDER — ALBUTEROL SULFATE HFA 108 (90 BASE) MCG/ACT IN AERS
INHALATION_SPRAY | RESPIRATORY_TRACT | Status: AC
  Filled 2015-01-16: qty 6.7

## 2015-01-16 MED ORDER — BUPIVACAINE HCL (PF) 0.25 % IJ SOLN
INTRAMUSCULAR | Status: AC
  Filled 2015-01-16: qty 30

## 2015-01-16 MED ORDER — GLYCOPYRROLATE 0.2 MG/ML IJ SOLN
INTRAMUSCULAR | Status: AC
  Filled 2015-01-16: qty 3

## 2015-01-16 MED ORDER — HYDROMORPHONE HCL 1 MG/ML IJ SOLN
0.2500 mg | INTRAMUSCULAR | Status: DC | PRN
  Administered 2015-01-16 (×2): 0.5 mg via INTRAVENOUS

## 2015-01-16 MED ORDER — LACTATED RINGERS IR SOLN
Status: DC | PRN
  Administered 2015-01-16: 1000 mL

## 2015-01-16 MED ORDER — SUFENTANIL CITRATE 50 MCG/ML IV SOLN
INTRAVENOUS | Status: AC
  Filled 2015-01-16: qty 1

## 2015-01-16 MED ORDER — EPHEDRINE SULFATE 50 MG/ML IJ SOLN
INTRAMUSCULAR | Status: DC | PRN
  Administered 2015-01-16 (×2): 5 mg via INTRAVENOUS

## 2015-01-16 MED ORDER — CISATRACURIUM BESYLATE 20 MG/10ML IV SOLN
INTRAVENOUS | Status: AC
  Filled 2015-01-16: qty 20

## 2015-01-16 MED ORDER — SODIUM CHLORIDE 0.9 % IJ SOLN
INTRAMUSCULAR | Status: AC
  Filled 2015-01-16: qty 30

## 2015-01-16 MED ORDER — DIPHENHYDRAMINE HCL 12.5 MG/5ML PO ELIX: 12.5000 mg | ORAL_SOLUTION | Freq: Four times a day (QID) | ORAL | Status: DC | PRN

## 2015-01-16 MED ORDER — INSULIN ASPART 100 UNIT/ML ~~LOC~~ SOLN
0.0000 [IU] | Freq: Three times a day (TID) | SUBCUTANEOUS | Status: DC
  Administered 2015-01-17: 2 [IU] via SUBCUTANEOUS
  Administered 2015-01-17 (×2): 3 [IU] via SUBCUTANEOUS
  Administered 2015-01-18: 2 [IU] via SUBCUTANEOUS
  Administered 2015-01-18 (×2): 3 [IU] via SUBCUTANEOUS
  Administered 2015-01-19 – 2015-01-21 (×5): 2 [IU] via SUBCUTANEOUS
  Administered 2015-01-21 – 2015-01-22 (×2): 3 [IU] via SUBCUTANEOUS

## 2015-01-16 MED ORDER — HYDROMORPHONE 0.3 MG/ML IV SOLN
INTRAVENOUS | Status: DC
  Administered 2015-01-16: 0.3 mg via INTRAVENOUS
  Administered 2015-01-16: 2.1 mg via INTRAVENOUS
  Administered 2015-01-17: 3.3 mL via INTRAVENOUS
  Administered 2015-01-17 (×2): 0.3 mg via INTRAVENOUS
  Administered 2015-01-17: 0.72 mg via INTRAVENOUS
  Administered 2015-01-17: 2.4 mL via INTRAVENOUS
  Administered 2015-01-17: 1.2 mg via INTRAVENOUS
  Administered 2015-01-17: 10:00:00 via INTRAVENOUS
  Administered 2015-01-18: 3.6 mL via INTRAVENOUS
  Administered 2015-01-18: 0.9 mL via INTRAVENOUS
  Administered 2015-01-18: 1.07 mg via INTRAVENOUS
  Administered 2015-01-18: 05:00:00 via INTRAVENOUS
  Administered 2015-01-18: 0.9 mg via INTRAVENOUS
  Administered 2015-01-18: 0.6 mg via INTRAVENOUS
  Administered 2015-01-18: 1.8 mL via INTRAVENOUS
  Administered 2015-01-18: 1.2 mg via INTRAVENOUS
  Administered 2015-01-19: 2.4 mg via INTRAVENOUS
  Administered 2015-01-19: 3.9 mg via INTRAVENOUS
  Administered 2015-01-19: 4.8 mg via INTRAVENOUS
  Administered 2015-01-19 (×2): via INTRAVENOUS
  Administered 2015-01-19: 2.7 mg via INTRAVENOUS
  Administered 2015-01-20: 0.9 mg via INTRAVENOUS
  Administered 2015-01-20: 2.4 mg via INTRAVENOUS
  Administered 2015-01-20: 0.9 mg via INTRAVENOUS
  Filled 2015-01-16 (×5): qty 25

## 2015-01-16 MED ORDER — PROPOFOL 10 MG/ML IV BOLUS
INTRAVENOUS | Status: DC | PRN
  Administered 2015-01-16: 200 mg via INTRAVENOUS

## 2015-01-16 MED ORDER — MIDAZOLAM HCL 5 MG/5ML IJ SOLN
INTRAMUSCULAR | Status: DC | PRN
  Administered 2015-01-16 (×2): 1 mg via INTRAVENOUS

## 2015-01-16 MED ORDER — LACTATED RINGERS IV SOLN: INTRAVENOUS | Status: DC

## 2015-01-16 MED ORDER — ROCURONIUM BROMIDE 100 MG/10ML IV SOLN
INTRAVENOUS | Status: DC | PRN
  Administered 2015-01-16: 5 mg via INTRAVENOUS

## 2015-01-16 MED ORDER — ACETAMINOPHEN 325 MG PO TABS: 650.0000 mg | ORAL_TABLET | ORAL | Status: DC | PRN

## 2015-01-16 MED ORDER — HYDROMORPHONE HCL 1 MG/ML IJ SOLN
INTRAMUSCULAR | Status: DC | PRN
  Administered 2015-01-16 (×6): 0.5 mg via INTRAVENOUS

## 2015-01-16 MED ORDER — GLYCOPYRROLATE 0.2 MG/ML IJ SOLN
INTRAMUSCULAR | Status: DC | PRN
  Administered 2015-01-16: 0.2 mg via INTRAVENOUS
  Administered 2015-01-16: 0.1 mg via INTRAVENOUS
  Administered 2015-01-16: 0.6 mg via INTRAVENOUS
  Administered 2015-01-16: 0.1 mg via INTRAVENOUS

## 2015-01-16 MED ORDER — PIPERACILLIN-TAZOBACTAM 3.375 G IVPB
3.3750 g | Freq: Three times a day (TID) | INTRAVENOUS | Status: DC
  Administered 2015-01-16 – 2015-01-21 (×14): 3.375 g via INTRAVENOUS
  Filled 2015-01-16 (×13): qty 50

## 2015-01-16 MED ORDER — METOCLOPRAMIDE HCL 5 MG/ML IJ SOLN
INTRAMUSCULAR | Status: DC | PRN
  Administered 2015-01-16: 10 mg via INTRAVENOUS

## 2015-01-16 MED ORDER — DEXAMETHASONE SODIUM PHOSPHATE 10 MG/ML IJ SOLN
INTRAMUSCULAR | Status: AC
  Filled 2015-01-16: qty 1

## 2015-01-16 MED ORDER — HYDROMORPHONE HCL 2 MG/ML IJ SOLN
INTRAMUSCULAR | Status: AC
  Filled 2015-01-16: qty 1

## 2015-01-16 MED ORDER — LIDOCAINE HCL (CARDIAC) 20 MG/ML IV SOLN
INTRAVENOUS | Status: AC
  Filled 2015-01-16: qty 5

## 2015-01-16 MED ORDER — LACTATED RINGERS IV SOLN
INTRAVENOUS | Status: DC | PRN
  Administered 2015-01-16 (×2): via INTRAVENOUS

## 2015-01-16 MED ORDER — DIPHENHYDRAMINE HCL 50 MG/ML IJ SOLN: 12.5000 mg | Freq: Four times a day (QID) | INTRAMUSCULAR | Status: DC | PRN

## 2015-01-16 MED ORDER — HYDROMORPHONE HCL 1 MG/ML IJ SOLN
INTRAMUSCULAR | Status: AC
  Filled 2015-01-16: qty 1

## 2015-01-16 MED ORDER — NEOSTIGMINE METHYLSULFATE 10 MG/10ML IV SOLN
INTRAVENOUS | Status: AC
  Filled 2015-01-16: qty 1

## 2015-01-16 MED ORDER — SODIUM CHLORIDE 0.9 % IR SOLN
Status: DC | PRN
  Administered 2015-01-16: 2000 mL

## 2015-01-16 MED ORDER — SUFENTANIL CITRATE 50 MCG/ML IV SOLN
INTRAVENOUS | Status: DC | PRN
  Administered 2015-01-16: 5 ug via INTRAVENOUS
  Administered 2015-01-16: 10 ug via INTRAVENOUS
  Administered 2015-01-16: 5 ug via INTRAVENOUS
  Administered 2015-01-16 (×2): 10 ug via INTRAVENOUS
  Administered 2015-01-16 (×2): 5 ug via INTRAVENOUS
  Administered 2015-01-16: 10 ug via INTRAVENOUS
  Administered 2015-01-16 (×2): 5 ug via INTRAVENOUS
  Administered 2015-01-16: 10 ug via INTRAVENOUS
  Administered 2015-01-16 (×4): 5 ug via INTRAVENOUS

## 2015-01-16 MED ORDER — STERILE WATER FOR IRRIGATION IR SOLN
Status: DC | PRN
  Administered 2015-01-16: 1000 mL

## 2015-01-16 MED ORDER — SUCCINYLCHOLINE CHLORIDE 20 MG/ML IJ SOLN
INTRAMUSCULAR | Status: DC | PRN
  Administered 2015-01-16: 100 mg via INTRAVENOUS

## 2015-01-16 MED ORDER — LIDOCAINE HCL (CARDIAC) 20 MG/ML IV SOLN
INTRAVENOUS | Status: DC | PRN
  Administered 2015-01-16: 60 mg via INTRAVENOUS

## 2015-01-16 MED ORDER — CISATRACURIUM BESYLATE (PF) 10 MG/5ML IV SOLN
INTRAVENOUS | Status: DC | PRN
  Administered 2015-01-16: 5 mg via INTRAVENOUS
  Administered 2015-01-16: 2 mg via INTRAVENOUS
  Administered 2015-01-16: 3 mg via INTRAVENOUS
  Administered 2015-01-16: 4 mg via INTRAVENOUS
  Administered 2015-01-16: 6 mg via INTRAVENOUS
  Administered 2015-01-16: 2 mg via INTRAVENOUS
  Administered 2015-01-16: 6 mg via INTRAVENOUS

## 2015-01-16 MED ORDER — ONDANSETRON HCL 4 MG/2ML IJ SOLN
INTRAMUSCULAR | Status: AC
  Filled 2015-01-16: qty 2

## 2015-01-16 MED ORDER — SODIUM CHLORIDE 0.9 % IV SOLN
INTRAVENOUS | Status: DC
  Administered 2015-01-16 – 2015-01-18 (×3): via INTRAVENOUS

## 2015-01-16 MED ORDER — PIPERACILLIN-TAZOBACTAM 3.375 G IVPB
INTRAVENOUS | Status: AC
  Filled 2015-01-16: qty 50

## 2015-01-16 MED ORDER — ROCURONIUM BROMIDE 100 MG/10ML IV SOLN
INTRAVENOUS | Status: AC
  Filled 2015-01-16: qty 1

## 2015-01-16 MED ORDER — BUPIVACAINE ON-Q PAIN PUMP (FOR ORDER SET NO CHG)
INJECTION | Status: AC
  Filled 2015-01-16: qty 1

## 2015-01-16 MED ORDER — HYDROMORPHONE 0.3 MG/ML IV SOLN
INTRAVENOUS | Status: AC
  Filled 2015-01-16: qty 25

## 2015-01-16 MED ORDER — DEXAMETHASONE SODIUM PHOSPHATE 10 MG/ML IJ SOLN
INTRAMUSCULAR | Status: DC | PRN
  Administered 2015-01-16: 10 mg via INTRAVENOUS

## 2015-01-16 MED ORDER — GLYCOPYRROLATE 0.2 MG/ML IJ SOLN
INTRAMUSCULAR | Status: AC
  Filled 2015-01-16: qty 5

## 2015-01-16 MED ORDER — SODIUM CHLORIDE 0.9 % IJ SOLN
INTRAMUSCULAR | Status: AC
  Filled 2015-01-16: qty 3

## 2015-01-16 MED ORDER — BUPIVACAINE LIPOSOME 1.3 % IJ SUSP
20.0000 mL | Freq: Once | INTRAMUSCULAR | Status: DC
Start: 2015-01-16 — End: 2015-01-16
  Filled 2015-01-16: qty 20

## 2015-01-16 MED ORDER — INDOCYANINE GREEN 25 MG IV SOLR
5.0000 mg | INTRAVENOUS | Status: DC | PRN
  Administered 2015-01-16: 5 mg

## 2015-01-16 MED ORDER — MIDAZOLAM HCL 2 MG/2ML IJ SOLN
INTRAMUSCULAR | Status: AC
  Filled 2015-01-16: qty 2

## 2015-01-16 MED ORDER — METOPROLOL TARTRATE 1 MG/ML IV SOLN
INTRAVENOUS | Status: AC
  Filled 2015-01-16: qty 5

## 2015-01-16 MED ORDER — BUPIVACAINE 0.25 % ON-Q PUMP DUAL CATH 300 ML
300.0000 mL | INJECTION | Status: DC
  Filled 2015-01-16: qty 300

## 2015-01-16 SURGICAL SUPPLY — 95 items
APPLICATOR SURGIFLO ENDO (HEMOSTASIS) IMPLANT
BAG URINE DRAINAGE (UROLOGICAL SUPPLIES) IMPLANT
BAG URO CATCHER STRL LF (DRAPE) ×3 IMPLANT
BLADE EXTENDED COATED 6.5IN (ELECTRODE) ×3 IMPLANT
BLADE SURG SZ10 CARB STEEL (BLADE) ×3 IMPLANT
CABLE HIGH FREQUENCY MONO STRZ (ELECTRODE) IMPLANT
CATH FOLEY 2WAY SLVR 18FR 30CC (CATHETERS) ×3 IMPLANT
CATH ROBINSON RED A/P 16FR (CATHETERS) ×3 IMPLANT
CHLORAPREP W/TINT 26ML (MISCELLANEOUS) ×3 IMPLANT
CLIP LIGATING HEM O LOK PURPLE (MISCELLANEOUS) ×3 IMPLANT
CLIP LIGATING HEMO LOK XL GOLD (MISCELLANEOUS) ×6 IMPLANT
CLIP LIGATING HEMO O LOK GREEN (MISCELLANEOUS) ×3 IMPLANT
CLOTH BEACON ORANGE TIMEOUT ST (SAFETY) ×3 IMPLANT
COVER BACK TABLE 60X90IN (DRAPES) ×3 IMPLANT
COVER SURGICAL LIGHT HANDLE (MISCELLANEOUS) IMPLANT
COVER TIP SHEARS 8 DVNC (MISCELLANEOUS) ×1 IMPLANT
COVER TIP SHEARS 8MM DA VINCI (MISCELLANEOUS) ×2
DECANTER SPIKE VIAL GLASS SM (MISCELLANEOUS) IMPLANT
DERMABOND ADVANCED (GAUZE/BANDAGES/DRESSINGS) ×2
DERMABOND ADVANCED .7 DNX12 (GAUZE/BANDAGES/DRESSINGS) ×1 IMPLANT
DRAIN PENROSE 18X1/4 LTX STRL (WOUND CARE) ×3 IMPLANT
DRAPE ARM DVNC X/XI (DISPOSABLE) ×4 IMPLANT
DRAPE CAMERA CLOSED 9X96 (DRAPES) ×3 IMPLANT
DRAPE COLUMN DVNC XI (DISPOSABLE) ×1 IMPLANT
DRAPE DA VINCI XI ARM (DISPOSABLE) ×8
DRAPE DA VINCI XI COLUMN (DISPOSABLE) ×2
DRAPE WARM FLUID 44X44 (DRAPE) ×3 IMPLANT
DRSG TEGADERM 4X4.75 (GAUZE/BANDAGES/DRESSINGS) ×3 IMPLANT
DRSG TEGADERM 6X8 (GAUZE/BANDAGES/DRESSINGS) IMPLANT
ELECT CAUTERY BLADE 6.4 (BLADE) ×3 IMPLANT
ELECT REM PT RETURN 9FT ADLT (ELECTROSURGICAL) ×6
ELECTRODE REM PT RTRN 9FT ADLT (ELECTROSURGICAL) ×2 IMPLANT
GAUZE SPONGE 4X4 12PLY STRL (GAUZE/BANDAGES/DRESSINGS) ×3 IMPLANT
GLOVE BIO SURGEON STRL SZ 6.5 (GLOVE) ×6 IMPLANT
GLOVE BIO SURGEONS STRL SZ 6.5 (GLOVE) ×3
GLOVE BIOGEL M STRL SZ7.5 (GLOVE) ×15 IMPLANT
GOWN STRL REUS W/TWL LRG LVL3 (GOWN DISPOSABLE) ×18 IMPLANT
LIQUID BAND (GAUZE/BANDAGES/DRESSINGS) ×6 IMPLANT
LOOP MINI RED (MISCELLANEOUS) IMPLANT
LOOP VESSEL MAXI BLUE (MISCELLANEOUS) ×3 IMPLANT
MANIFOLD NEPTUNE II (INSTRUMENTS) ×3 IMPLANT
NEEDLE INSUFFLATION 14GA 120MM (NEEDLE) IMPLANT
PACK CYSTO (CUSTOM PROCEDURE TRAY) ×3 IMPLANT
PACK ROBOT UROLOGY CUSTOM (CUSTOM PROCEDURE TRAY) ×3 IMPLANT
POUCH ENDO CATCH II 15MM (MISCELLANEOUS) ×3 IMPLANT
PUMP PAIN ON-Q (MISCELLANEOUS) ×3 IMPLANT
RELOAD STAPLER GOLD 60MM (STAPLE) ×1 IMPLANT
RELOAD STAPLER GREEN 60MM (STAPLE) ×4 IMPLANT
RELOAD STAPLER WHITE 60MM (STAPLE) ×10 IMPLANT
RETRACTOR LONRSTAR 16.6X16.6CM (MISCELLANEOUS) ×1 IMPLANT
RETRACTOR STAY HOOK 5MM (MISCELLANEOUS) ×3 IMPLANT
RETRACTOR STER APS 16.6X16.6CM (MISCELLANEOUS) ×3
SEAL CANN UNIV 5-8 DVNC XI (MISCELLANEOUS) ×4 IMPLANT
SEAL XI 5MM-8MM UNIVERSAL (MISCELLANEOUS) ×8
SET TUBE IRRIG SUCTION NO TIP (IRRIGATION / IRRIGATOR) ×3 IMPLANT
SHEET LAVH (DRAPES) ×3 IMPLANT
SOLUTION ELECTROLUBE (MISCELLANEOUS) ×3 IMPLANT
SPONGE LAP 18X18 X RAY DECT (DISPOSABLE) ×9 IMPLANT
SPONGE LAP 4X18 X RAY DECT (DISPOSABLE) ×3 IMPLANT
STAPLE ECHEON FLEX 60 POW ENDO (STAPLE) ×3 IMPLANT
STAPLER RELOAD GOLD 60MM (STAPLE) ×3
STAPLER RELOAD GREEN 60MM (STAPLE) ×12
STAPLER RELOAD WHITE 60MM (STAPLE) ×30
STENT SET URETHERAL LEFT 7FR (STENTS) ×3 IMPLANT
STENT SET URETHERAL RIGHT 7FR (STENTS) ×3 IMPLANT
SURGIFLO W/THROMBIN 8M KIT (HEMOSTASIS) IMPLANT
SUT CHROMIC 4 0 RB 1X27 (SUTURE) ×9 IMPLANT
SUT ETHILON 3 0 PS 1 (SUTURE) ×3 IMPLANT
SUT MNCRL AB 4-0 PS2 18 (SUTURE) ×9 IMPLANT
SUT PDS AB 0 CTX 36 PDP370T (SUTURE) ×18 IMPLANT
SUT SILK 2 0 SH (SUTURE) ×3 IMPLANT
SUT SILK 3 0 SH 30 (SUTURE) IMPLANT
SUT SILK 3 0 SH CR/8 (SUTURE) ×3 IMPLANT
SUT VIC AB 2-0 SH 27 (SUTURE) ×8
SUT VIC AB 2-0 SH 27X BRD (SUTURE) ×4 IMPLANT
SUT VIC AB 2-0 UR5 27 (SUTURE) ×12 IMPLANT
SUT VIC AB 3-0 SH 27 (SUTURE) ×18
SUT VIC AB 3-0 SH 27X BRD (SUTURE) ×1 IMPLANT
SUT VIC AB 3-0 SH 27XBRD (SUTURE) ×8 IMPLANT
SUT VIC AB 4-0 PS2 18 (SUTURE) ×6 IMPLANT
SUT VIC AB 4-0 RB1 27 (SUTURE) ×8
SUT VIC AB 4-0 RB1 27XBRD (SUTURE) ×4 IMPLANT
SUT VICRYL 0 UR6 27IN ABS (SUTURE) ×3 IMPLANT
SUT VLOC BARB 180 ABS3/0GR12 (SUTURE) ×3
SUTURE VLOC BRB 180 ABS3/0GR12 (SUTURE) ×1 IMPLANT
SYSTEM UROSTOMY GENTLE TOUCH (WOUND CARE) ×3 IMPLANT
TOWEL OR NON WOVEN STRL DISP B (DISPOSABLE) ×3 IMPLANT
TROCAR 12M 150ML BLUNT (TROCAR) ×3 IMPLANT
TROCAR BLADELESS 15MM (ENDOMECHANICALS) ×3 IMPLANT
TUBING CONNECTING 10 (TUBING) ×2 IMPLANT
TUBING CONNECTING 10' (TUBING) ×1
URINEMETER 200ML W/220 (MISCELLANEOUS) IMPLANT
WATER STERILE IRR 1000ML UROMA (IV SOLUTION) ×3 IMPLANT
WATER STERILE IRR 1500ML POUR (IV SOLUTION) ×6 IMPLANT
YANKAUER SUCT BULB TIP 10FT TU (MISCELLANEOUS) ×3 IMPLANT

## 2015-01-16 NOTE — Brief Op Note (Signed)
01/15/2015 - 01/16/2015  3:36 PM  PATIENT:  Mario Reese  69 y.o. male  PRE-OPERATIVE DIAGNOSIS:  BLADDER CANCER  POST-OPERATIVE DIAGNOSIS:  bladder cancer  PROCEDURE:  Procedure(s): ROBOTIC ASSISTED CYSTOPROSTATECTOMY AND URETHRECTOMY  WITH CONDUIT  (N/A) CYSTOSCOPY INDOCYANINE GREEN DYE, (N/A)  SURGEON:  Surgeon(s) and Role:    * Alexis Frock, MD - Primary  PHYSICIAN ASSISTANT:   ASSISTANTS: 1 - Clemetine Marker PA; 2 - Charlton Haws, MD   ANESTHESIA:   local and general  EBL:  Total I/O In: 4000 [I.V.:4000] Out: 650 [Urine:150; Blood:500]  BLOOD ADMINISTERED:none  DRAINS: urostomy to gravity, JP to bulb, Pain catheters to bupivicaine infusion   LOCAL MEDICATIONS USED:  MARCAINE     SPECIMEN:  Source of Specimen:  cystoprostatectomy, urethrectomy, pelvic lymph nodes, ureteral margins  DISPOSITION OF SPECIMEN:  PATHOLOGY  COUNTS:  YES  TOURNIQUET:  * No tourniquets in log *  DICTATION: .Other Dictation: Dictation Number dictation number did not give number ID #.   PLAN OF CARE: Admit to inpatient   PATIENT DISPOSITION:  PACU - hemodynamically stable.   Delay start of Pharmacological VTE agent (>24hrs) due to surgical blood loss or risk of bleeding: yes

## 2015-01-16 NOTE — Anesthesia Postprocedure Evaluation (Signed)
  Anesthesia Post-op Note  Patient: Mario Reese  Procedure(s) Performed: Procedure(s) (LRB): ROBOTIC ASSISTED CYSTOPROSTATECTOMY AND URETHRECTOMY  WITH CONDUIT  (N/A) CYSTOSCOPY INDOCYANINE GREEN DYE, (N/A)  Patient Location: PACU  Anesthesia Type: General  Level of Consciousness: awake and alert   Airway and Oxygen Therapy: Patient Spontanous Breathing  Post-op Pain: mild  Post-op Assessment: Post-op Vital signs reviewed, Patient's Cardiovascular Status Stable, Respiratory Function Stable, Patent Airway and No signs of Nausea or vomiting  Last Vitals:  Filed Vitals:   01/16/15 1645  BP: 142/116  Pulse: 112  Temp:   Resp: 15    Post-op Vital Signs: stable   Complications: No apparent anesthesia complications

## 2015-01-16 NOTE — Transfer of Care (Signed)
Immediate Anesthesia Transfer of Care Note  Patient: DERRELL MILANES  Procedure(s) Performed: Procedure(s): ROBOTIC ASSISTED CYSTOPROSTATECTOMY AND URETHRECTOMY  WITH CONDUIT  (N/A) CYSTOSCOPY INDOCYANINE GREEN DYE, (N/A)  Patient Location: PACU  Anesthesia Type:General  Level of Consciousness: awake, sedated and patient cooperative  Airway & Oxygen Therapy: Patient Spontanous Breathing and Patient connected to face mask oxygen  Post-op Assessment: Report given to RN and Post -op Vital signs reviewed and stable  Post vital signs: Reviewed and stable  Last Vitals:  Filed Vitals:   01/16/15 0543  BP: 153/78  Pulse: 60  Temp: 36.6 C  Resp: 16    Complications: No apparent anesthesia complications

## 2015-01-16 NOTE — Progress Notes (Signed)
Spoke with OR. Pt to be picked up at [phone]. Will send ABX down with patient.

## 2015-01-16 NOTE — Progress Notes (Signed)
Day of Surgery  Subjective:  1 - Recurrent Bladder Cancer - Undergoing cystectomy today for recurrence / progressive bladder cancer, now clinically localized stage 2. He completed bowel prep to clear last PM. Hgb and Cr acceptable. Wound osotomy consult complete / stomal marking done.   Today Mario Reese is seen prior to cystectomy. No events over night.   Objective: Vital signs in last 24 hours: Temp:  [97.5 F (36.4 C)-97.9 F (36.6 C)] 97.8 F (36.6 C) (01/29 0543) Pulse Rate:  [58-64] 60 (01/29 0543) Resp:  [16-18] 16 (01/29 0543) BP: (127-153)/(68-88) 153/78 mmHg (01/29 0543) SpO2:  [91 %-96 %] 96 % (01/29 0543) Weight:  [114.306 kg (252 lb)] 114.306 kg (252 lb) (01/28 1202) Last BM Date: 01/15/15  Intake/Output from previous day: 01/28 0701 - 01/29 0700 In: 826.7 [P.O.:120; I.V.:706.7] Out: -  Intake/Output this shift:    General appearance: alert, cooperative and appears stated age Head: Normocephalic, without obvious abnormality, atraumatic Nose: Nares normal. Septum midline. Mucosa normal. No drainage or sinus tenderness. Throat: lips, mucosa, and tongue normal; teeth and gums normal Neck: supple, symmetrical, trachea midline Back: symmetric, no curvature. ROM normal. No CVA tenderness. Resp: non-labored on room air Chest wall: no tenderness Cardio: regular rate and rhythm, S1, S2 normal, no murmur, click, rub or gallop GI: soft, non-tender; bowel sounds normal; no masses,  no organomegaly and Rt sided ostomy marking in place.  Male genitalia: normal Extremities: edema (none) Pulses: 2+ and symmetric Skin: Skin color, texture, turgor normal. No rashes or lesions Lymph nodes: Cervical, supraclavicular, and axillary nodes normal. Neurologic: Grossly normal  Lab Results:   Recent Labs  01/15/15 1336  WBC 6.4  HGB 16.2  HCT 44.4  PLT 128*   BMET  Recent Labs  01/15/15 1336  NA 141  K 3.6  CL 102  CO2 32  GLUCOSE 145*  BUN 13  CREATININE 0.81  CALCIUM  9.0   PT/INR No results for input(s): LABPROT, INR in the last 72 hours. ABG No results for input(s): PHART, HCO3 in the last 72 hours.  Invalid input(s): PCO2, PO2  Studies/Results: No results found.  Anti-infectives: Anti-infectives    Start     Dose/Rate Route Frequency Ordered Stop   01/16/15 0600  piperacillin-tazobactam (ZOSYN) IVPB 3.375 g     3.375 g100 mL/hr over 30 Minutes Intravenous 30 min pre-op 01/15/15 1140 01/16/15 0703      Assessment/Plan:  1 - Recurrent Bladder Cancer - proceed with cystoprostatecotmy today. Pt and family have good understanding of risk / benefits and expected peri-op course.   [facility], Rabecka Brendel 01/16/2015

## 2015-01-17 LAB — GLUCOSE, CAPILLARY
GLUCOSE-CAPILLARY: 230 mg/dL — AB (ref 70–99)
GLUCOSE-CAPILLARY: 158 mg/dL — AB (ref 70–99)
Glucose-Capillary: 171 mg/dL — ABNORMAL HIGH (ref 70–99)
Glucose-Capillary: 148 mg/dL — ABNORMAL HIGH (ref 70–99)

## 2015-01-17 LAB — BASIC METABOLIC PANEL
ANION GAP: 7 (ref 5–15)
BUN: 11 mg/dL (ref 6–23)
CO2: 28 mmol/L (ref 19–32)
Calcium: 7.8 mg/dL — ABNORMAL LOW (ref 8.4–10.5)
Chloride: 104 mmol/L (ref 96–112)
Creatinine, Ser: 1.01 mg/dL (ref 0.50–1.35)
GFR calc Af Amer: 86 mL/min — ABNORMAL LOW (ref >90)
GFR calc non Af Amer: 74 mL/min — ABNORMAL LOW (ref >90)
Glucose, Bld: 199 mg/dL — ABNORMAL HIGH (ref 70–99)
Potassium: 4 mmol/L (ref 3.5–5.1)
Sodium: 139 mmol/L (ref 135–145)

## 2015-01-17 LAB — CBC
HEMATOCRIT: 41.1 % (ref 39.0–52.0)
Hemoglobin: 14.4 g/dL (ref 13.0–17.0)
MCH: 34.2 pg — ABNORMAL HIGH (ref 26.0–34.0)
MCHC: 35 g/dL (ref 30.0–36.0)
MCV: 97.6 fL (ref 78.0–100.0)
PLATELETS: 129 10*3/uL — AB (ref 150–400)
RBC: 4.21 MIL/uL — AB (ref 4.22–5.81)
RDW: 12.6 % (ref 11.5–15.5)
WBC: 15.2 10*3/uL — AB (ref 4.0–10.5)

## 2015-01-17 MED ORDER — HEPARIN SODIUM (PORCINE) 5000 UNIT/ML IJ SOLN
5000.0000 [IU] | Freq: Three times a day (TID) | INTRAMUSCULAR | Status: DC
  Administered 2015-01-17 – 2015-01-22 (×15): 5000 [IU] via SUBCUTANEOUS
  Filled 2015-01-17 (×16): qty 1

## 2015-01-17 NOTE — Progress Notes (Signed)
Vitals normal Hb 14.4 WBC normal Cr 1.01 Incisions look great Appropriate scrotal swelling/purple Mobilize  Start heparin as noted Labs ordered

## 2015-01-17 NOTE — Op Note (Signed)
NAME:  Mario Reese, Mario Reese NO.:  [id number]  MEDICAL RECORD NO.:  [id number]  LOCATION:  85                         FACILITY:  [facility]  PHYSICIAN:  Alexis Frock, MD     DATE OF BIRTH:  1946-05-27  DATE OF PROCEDURE: 01/16/2015  DATE OF DISCHARGE:                              OPERATIVE REPORT   PREOPERATIVE DIAGNOSIS:  High-grade bladder cancer.  POSTOPERATIVE DIAGNOSES: 1. 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  PROCEDURE: 1. Cystoscopy with indocyanine green dye injection. 2. Robotic-assisted laparoscopic radical cystoprostatectomy with     bilateral pelvic lymphadenectomy and fluorescence lymphangiography and ileal conduit urinary diversion. 3. Open total urethrectomy.  ASSISTANTS: 1. Betha Loa, PA. 2. Charlton Haws, MD.  ESTIMATED BLOOD LOSS:  300 mL.  COMPLICATIONS:  None.  DRAINS: 1. Jackson-Pratt drain to bulb suction. 2. Urostomy to gravity drainage with bander stents in situ, right is     red, left is blue. 3. Bupivacaine pain catheters, one in the perineal incision and     another in the abdominal incision, bupivacaine slow release.  SPECIMENS: 1. Urethral tumor frozen section positive for high-grade carcinoma. 2. Right distal ureteral margin negative for carcinoma, frozen section. 3. Left distal ureteral margin negative for carcinoma, frozen section. 4. Cystoprostatectomy. 5. Right external iliac lymph node, sentinel. 6. Right obturator lymph node, sentinel. 7. Right common iliac lymph node, sentinel. 8. Left common iliac lymph node, sentinel. 9. Left external iliac lymph node, sentinel. 10.Left obturator lymph node, sentinel. 11.Urethrectomy. 12.Distal permanent urethral margin. 13.Permanent right ureteral margin. 14.Permanent left distal ureteral margin.  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  He initially had superficial  disease which was managed with transurethral resection and intravesical therapies.  He unfortunately has had recurrence of bladder tumor that is now high grade and muscle invasive.  He underwent staging imaging that was unremarkable for locally advanced or distant disease.  Options were discussed with the patient including palliative protocols versus ablation versus surgical extirpation with and without minimally invasive assistance and with and without neoadjuvant chemotherapy.  He wished to proceed with robotic cystoprostatectomy in curative intent without neoadjuvant chemotherapy.  Informed consent was obtained and placed in medical record.  PROCEDURE IN DETAIL:  The patient being Mario Reese was verified, procedure being cystoprostatectomy was confirmed.  Procedure was carried out.  Timeout was performed.  Intravenous antibiotics were administered. General endotracheal anesthesia was induced.  The patient was initially placed into a low lithotomy position.  Sterile field was created by prepping and draping the patient's penis, perineum, proximal thighs using iodine x3.  Next, cystourethroscopy was performed using a 24- [place] injection scope with a 12-degree offset lens and visual obturator.  Inspection of the anterior urethra revealed new and multifocal, dense, mid pendulous urethral tissue, worrisome for possible carcinoma.  This was papillary appearing and nodular to palpation of the urethra.  There were no additional obvious urothelial lesions within the prostatic urethra and urinary bladder.  There were only some erythematous changes but no frank nodular mass or tumor.  Given the tumor that appeared to be distal to the bladder neck, frozen  section was obtained from the urethral tissue and was indeed found to be consistent with high-grade urothelial carcinoma.  At this point, I discussed with the patient's family the recommendations to proceed with concomitant total urethrectomy  and if not doing so would essentially offer no curative possibility of the procedures today.  We also discussed abandoning the procedure versus performing urethrectomy on a later date. The patient's family wished proceed with total urethrectomy today as the patient's intent was curative.  I discussed the additional potential morbidity as well as additional time required and they wished understanding.  During cystoscopic inspection, 2 mL of indocyanine green dye was injected into the bladder mucosa in small blebs, approximately 0.25 mL each, throughout the urinary bladder and bladder neck, concentrated in areas of visible erythema.  No injection was made into the prostatic urethra.  The patient was completely re-prepped and draped.  First clipper shaving for xiphoid, abdomen and prepping with chlorhexidine gluconate and his penis, perineum, and proximal thighs using iodine and LAVH strip was applied allowing access to his penis, perineum, and the xiphoid, and abdomen on operative field.  Foley catheter was placed per urethra to straight drain.  A high-flow low pressure pneumoperitoneum was obtained using Veress technique in the supraumbilical midline having passed the aspiration and drop test. Next, an 8 mm robotic camera port was placed in this location. Laparoscopic examination of the peritoneal cavity revealed no significant adhesions and no visceral injury and no obvious advanced disease.  Additional ports were placed as follows:  Right paramedian 15 mm assistant port at the previously marked urostomy site, right paramedian 8 mm robotic port lateral to the stomal site, right far lateral 12 mm system port, left paramedian 8 mm robotic port, and left far lateral 8 mm robotic port.  Robot was docked and passed through electronic checks.  After patient was placed into 22 degrees in Trendelenburg, attention was then directed at identification of the left ureter.  Incision was made lateral  to the left medial umbilical ligament from the area of the internal ring along the area of the iliac vessels and then lateral to the descending colon.  The peritoneal flap was carefully swept medially.  Dissection proceeded directly on the iliac vessels.  The left ureter was encountered, marked with vessel loop, dissected superiorly for a distance of approximately 8 cm above the iliac crossing and then distally to the ureterovesical junction where it was doubly clipped and ligated with proximal clipping and tag suture. Frozen section was sent of the distal ureteral margin which was negative for carcinoma.  Attention directed to the left-sided pelvic lymphadenectomy.  Left hand pelvis was inspected under near infrared fluorescence.  There were numerous sentinel pelvic lymph nodes noted within the left hemipelvis including left common iliac, external iliac, and obturator lymph nodes.  There were no obvious sentinel, perivesical, or internal iliac lymph nodes at the area of the ureteral bifurcation. Next, lymphadenectomy was performed of the left common iliac group. Lymphostasis was achieved with cold clips and special attention was directed at removal of all the fluorescent lymph nodes.  This set aside, labeled as left common iliac lymph node, sentinel.  Next, all fibrofatty tissue withen the confines to left external iliac artery, vein, pelvic sidewall, and iliac bifurcation were mobilized.  Lymphostasis was achieved with cold clips, set aside, labeled as left external iliac lymph nodes, sentinel.  Next, all fibrofatty tissue withen the confines to the left external iliac vein, obturator nerve, pelvic sidewall were also  mobilized.  Lymphostasis was achieved with cold clips, set aside, labeled as left obturator lymph node, sentinel. Left hemipelvis was once again inspected under near infrared fluorescence.  No hyperfluorescent lymph nodes had been removed.  The obturator nerve was inspected  and found to be uninjured grossly.  Next, a mirror image dissection was performed on the right side, tilting the right peritoneal flap lateral to the bladder and the ascending colon which was carefully swept medially.  The right ureter was identified, dissected for distance of 4 cm above the iliac crossing and distally at the ureterovesical junction where it was doubly clipped and ligated, approximated with tag suture.  Frozen section sent, negative for carcinoma of the ureteral margin.  Right hemipelvis was also inspected under near infrared fluorescence and similarly there were sentinel lymph nodes in the right common iliac, right external iliac, and obturator packets.  There were no obvious sentinel lymph nodes seen proximally to this at the aortic bifurcation.  Next, lymph node dissection was performed on the right common iliac, right external iliac, and right obturator lymph nodes respectively as per the left.  These were labeled as such.  The right obturator nerve was inspected and was found to be uninjured.  Next, a posterior peritoneal flap was created by connecting the 2 previous peritoneal incisions and creating a flap of peritoneum which was placed posteriorly towards the descending colon and rectum. Dissection proceeded within this plane above peritoneal flap towards the area of the base of the prostate, then behind the prostate along the plane of Denonvilliers towards the prostate apex which was identified by the curvature upwards.  The endopelvic fascia was then carefully separated from the lateral aspect of prostate in base to apex orientation.  This completely exposed the bladder and prostate pedicles which were then controlled using sequential stapling technique beginning at the base of the bladder towards the apex of the prostate and this required 3 staple loads on each side.  Next, anterior attachments were taken down of the bladder developed the space of Retzius.  The  dorsal venous complex was identified and controlled using a vascular load stapler.  Apical dissection was performed obtaining maximal length of membranous urethra circumferentially which was then doubly clipped with extra large clips and ligated.  This completely freed up the cystoprostatectomy system which was placed to an extra large EndoCatch bag for later retrieval, the distal end serving as a marker for later urethrectomy.  Digital rectal exam was performed under laparoscopic vision and no rectal violation was noted.  The left ureter was retroperitonealized by tunneling behind the sigmoid colon and behind the previously developed peritoneal flap and bringing the left ureter to the right side.  These were tagged.  The terminal ileum was also identified using laparoscopic vision and a tagged suture was placed approximately 15 cm proximal to the ileocecal valve.  Next, the robot was undocked. The patient was taken out of Trendelenburg position.  Extraction site was made in the midline heading to the left side of the umbilicus, distance approximately 12 cm, dissecting down to the peritoneal cavity. The Omni-Tract retractor was deployed, the bowel was packed away to allow vision of the previously tagged terminal ileum and right and left distal ureters.  A segment of 18 cm in length of terminal ileum was taken out of continuity, 15 cm proximal to the ileocecal valve using bowel load stapler.  The mesentery was further developed using 2 vascular staple loads distally and one proximally, taking great care  to avoid devascularization of the conduit segment and this did not occur. The conduit segment was then laid in retroperitoneal orientation.  Bowel reanastomosis was performed using 2 loads of the bowel stapler from the antimesenteric border.  The free end was oversewn using running silk followed by a second imbricating layer of running silk.  Acute angle of the anastomosis was anchored  using a single silk suture.  The mesenteric defect was closed using interrupted silk.  The bowel anastomosis was visibly viable and palpably patent and redelivered into the abdominal cavity.  Attention was directed at ureteral ileal anastomosis.  First, the right ureter was spatulated for distance of approximately 7 mm and a suitable area of proximal conduit was excised for a distance of approximately 7 mm and 4 everting mucosal sutures were applied to this. Next, anchor heel stitch was provided with 4-0 Vicryl.  The red color bander stent was then placed at 25 cm to the anastomosis and running 4-0 ureteral anastomosis was performed using 2 separate running suture lines.  The red color bander stent was anchored using a single chromic stitch in the conduit.  Similarly, the left ureter was anastomosed in the exact same fashion on the contralateral side of the proximal conduit and the blue-colored bander stent was placed in the site at 28 cm to the anastomosis, also anchored using a chromic suture.  Sponge, instrument, and needle counts were correct for the abdominal portion.  The Omni-Tract retractor was taken down.  Closed suction drain was brought through the previous left lateral most robotic port site at the level of peritoneal cavity.  The right lateral most 52mm assitent port site was closed with fascia using figure-of-eight Vicryl.  Stomal maturation was performed by excising a quarter shaped incised area of skin directly on the previously marked stomal site and a column of fat was dissected down to the fascia.  Cruciate incision was made at this and 2-0 Vicryl was placed x4 at the corners.  The bander stents and then the conduit were brought through this in proper orientation and it was inspected through the extraction site and found to be not twisted.  This was anchored at the 4 corners at the fascia using the previously placed fascial sutures and then 4 rose budding  sutures were  applied and remaining 3-0 Vicryl resulted in excellent rose bud of the stoma, thus this will be patent and viable.  Extraction site was closed at the level of the fascia using figure-of-eight PDS x12 after omentum was brought over the extraction site.  A 5-inch On-Q pain catheter was tunneled directly into the fascia.  Scarpa's was closed over this with running Vicryl and the skin was closed using subcuticular Monocryl followed by Dermabond.  All additional port sites were closed at the level of the skin using subcuticular Monocryl followed by Dermabond.  Attention was then directed at total ureterectomy.  The patient was placed in a minimal Trendelenburg.  Anchoring sutures to the scrotum were applied superiorly and an incision was made approximately 6 cm in length in the median raphe of the peritoneum.  Dissection proceeded down to the bulbospongiosus muscle, went on to bulbar urethra which was carefully mobilized circumferentially.  Penrose drain was placed around this. This was then dissected distally to the fossa navicularis by everting the penis via the perineal incision.  Once confirming dissection to the fossa navicularis, the penis was reverted and the fossa navicularis was incised.  This completely freed up the distal aspect of  the total ureterectomy.  The glans penis portion of this was closed using vertical mattress x4.  The proximal aspect was then carefully mobilized from the bulb towards the area of the pelvic floor and dissection proceeded at the 12 o'clock position of this to enter into the peritoneal cavity. The previously placed clip was identified, allowing completion of the ureterectomy which was set aside for permanent pathology.  Notably, the distal aspect of this at the area of fossa navicularis was set aside as a separate specimen.  The ureterectomy incision was then closed layer by layer using 2-0 running Vicryl for closure of the perineal skin.  A separate On-Q  5-inch pain catheter was tunneled to this location from the right upper quadrant skin and  the skin was closed over this using horizontal mattress Monocryl, followed by dry dressing.  Following excellent hemostasis, all sponge and needle counts were correct.  We completed the goals of the surgery today.  The patient was taken to the postanesthesia care unit in stable condition.          ______________________________ Alexis Frock, MD     TM/MEDQ  D:  01/16/2015  T:  01/17/2015  Job:  [id number]

## 2015-01-18 DIAGNOSIS — M79661 Pain in right lower leg: Secondary | ICD-10-CM

## 2015-01-18 DIAGNOSIS — M79662 Pain in left lower leg: Secondary | ICD-10-CM

## 2015-01-18 LAB — CBC
HCT: 37.3 % — ABNORMAL LOW (ref 39.0–52.0)
Hemoglobin: 12.7 g/dL — ABNORMAL LOW (ref 13.0–17.0)
MCH: 34.4 pg — AB (ref 26.0–34.0)
MCHC: 34 g/dL (ref 30.0–36.0)
MCV: 101.1 fL — ABNORMAL HIGH (ref 78.0–100.0)
PLATELETS: 113 10*3/uL — AB (ref 150–400)
RBC: 3.69 MIL/uL — ABNORMAL LOW (ref 4.22–5.81)
RDW: 12.8 % (ref 11.5–15.5)
WBC: 12.9 10*3/uL — ABNORMAL HIGH (ref 4.0–10.5)

## 2015-01-18 LAB — BASIC METABOLIC PANEL
ANION GAP: 6 (ref 5–15)
BUN: 11 mg/dL (ref 6–23)
CALCIUM: 7.5 mg/dL — AB (ref 8.4–10.5)
CO2: 29 mmol/L (ref 19–32)
Chloride: 105 mmol/L (ref 96–112)
Creatinine, Ser: 0.87 mg/dL (ref 0.50–1.35)
GFR calc Af Amer: >90 mL/min (ref >90)
GFR, EST NON AFRICAN AMERICAN: 86 mL/min — AB (ref >90)
GLUCOSE: 144 mg/dL — AB (ref 70–99)
Potassium: 3.4 mmol/L — ABNORMAL LOW (ref 3.5–5.1)
SODIUM: 140 mmol/L (ref 135–145)

## 2015-01-18 LAB — GLUCOSE, CAPILLARY
GLUCOSE-CAPILLARY: 161 mg/dL — AB (ref 70–99)
Glucose-Capillary: 146 mg/dL — ABNORMAL HIGH (ref 70–99)
Glucose-Capillary: 152 mg/dL — ABNORMAL HIGH (ref 70–99)
Glucose-Capillary: 157 mg/dL — ABNORMAL HIGH (ref 70–99)
Glucose-Capillary: 131 mg/dL — ABNORMAL HIGH (ref 70–99)

## 2015-01-18 MED ORDER — POTASSIUM CHLORIDE IN NACL 20-0.9 MEQ/L-% IV SOLN
INTRAVENOUS | Status: DC
Start: 2015-01-18 — End: 2015-01-20
  Administered 2015-01-18 – 2015-01-20 (×4): via INTRAVENOUS
  Filled 2015-01-18 (×4): qty 1000

## 2015-01-18 NOTE — Evaluation (Addendum)
Physical Therapy Evaluation Patient Details Name: Mario Reese MRN: [id number] DOB: August 30, 1946 Today's Date: 01/18/2015   History of Present Illness  69 yo male adm 01/15/15 with bladder CA -->s/p ROBOTIC ASSISTED CYSTOPROSTATECTOMY AND URETHRECTOMY  WITH CONDUIT  on 01/16/15; PMHx  HTN, DM, former smoker  Clinical Impression  Pt admitted with above diagnosis. Pt currently with functional limitations due to the deficits listed below (see PT Problem List).  Pt will benefit from skilled PT to increase their independence and safety with mobility to allow discharge to the venue listed below.  Pt should progress well with mobility, recommending home with HHPT, may need RW will continue to follow and assess for D/C needs; supportive family     Follow Up Recommendations Home health PT;Supervision - Intermittent    Equipment Recommendations  Rolling walker with 5" wheels (?depending on progress)    Recommendations for Other Services       Precautions / Restrictions Precautions Precaution Comments: multiple lines, JP drain Restrictions Other Position/Activity Restrictions: 69 yo male adm 01/15/15 with bladder CA , underwent ROBOTIC ASSISTED CYSTOPROSTATECTOMY AND URETHRECTOMY WITH CONDUIT  on 01/16/15;      Mobility  Bed Mobility                  Transfers Overall transfer level: Needs assistance Equipment used: Rolling walker (2 wheeled) Transfers: Sit to/from Stand Sit to Stand: +2 physical assistance;Mod assist;+2 safety/equipment         General transfer comment: cues for hand placement, assist to rise  Ambulation/Gait Ambulation/Gait assistance: +2 physical assistance;+2 safety/equipment;Min assist Ambulation Distance (Feet): 180 Feet Assistive device: Rolling walker (2 wheeled) Gait Pattern/deviations: Step-through pattern;Trunk flexed;Decreased stride length;Narrow base of support     General Gait Details: cues to stay inside RW  Stairs             Wheelchair Mobility    Modified Rankin (Stroke Patients Only)       Balance Overall balance assessment: Needs assistance           Standing balance-Leahy Scale: Poor Standing balance comment: needs RW support to maintain standing today                             Pertinent Vitals/Pain Pain Assessment: 0-10 Pain Score: 5  Pain Location: abd with movement Pain Intervention(s): Limited activity within patient's tolerance;Monitored during session;PCA encouraged Pt with c/o L knee pain since surgery--reports better after amb, not rated, RN aware  HR 77-90--80s sats  90% --88%--93% on 3L [place] throughout BP 146/76    Home Living Family/patient expects to be discharged to:: Private residence Living Arrangements: Spouse/significant other Available Help at Discharge: Family Type of Home: House Home Access: Level entry     Home Layout: One level Home Equipment: Cane - single point      Prior Function Level of Independence: Independent               Hand Dominance        Extremity/Trunk Assessment   Upper Extremity Assessment: Overall WFL for tasks assessed           Lower Extremity Assessment: Overall WFL for tasks assessed         Communication   Communication: No difficulties  Cognition Arousal/Alertness: Awake/alert Behavior During Therapy: WFL for tasks assessed/performed Overall Cognitive Status: Within Functional Limits for tasks assessed  General Comments      Exercises        Assessment/Plan    PT Assessment Patient needs continued PT services  PT Diagnosis Difficulty walking   PT Problem List Decreased activity tolerance;Decreased balance;Decreased mobility;Decreased strength;Decreased knowledge of use of DME  PT Treatment Interventions DME instruction;Gait training;Functional mobility training;Therapeutic activities;Balance training;Therapeutic exercise   PT Goals (Current goals can be  found in the Care Plan section) Acute Rehab PT Goals Patient Stated Goal: to walk better, back to I PT Goal Formulation: With patient Time For Goal Achievement: 02/01/15 Potential to Achieve Goals: Good    Frequency Min 3X/week   Barriers to discharge        Co-evaluation               End of Session Equipment Utilized During Treatment: Gait belt Activity Tolerance: Patient tolerated treatment well Patient left: in chair;with call bell/phone within reach;with family/visitor present Nurse Communication: Mobility status         Time: 3291-9166 PT Time Calculation (min) (ACUTE ONLY): 34 min   Charges:   PT Evaluation $Initial PT Evaluation Tier I: 1 Procedure PT Treatments $Gait Training: 8-22 mins   PT G Codes:        Shaivi Rothschild 19-Jan-2015, 4:21 PM

## 2015-01-18 NOTE — Progress Notes (Signed)
VASCULAR LAB PRELIMINARY  PRELIMINARY  PRELIMINARY  PRELIMINARY  Bilateral lower extremity venous duplex completed.    Preliminary report:  Bilateral:  No evidence of DVT, superficial thrombosis, or Baker's Cyst.   Roshaun Pound, RVS 01/18/2015, 11:24 AM

## 2015-01-18 NOTE — Progress Notes (Signed)
Looks good Incisions etc good Abdominal cramps BMP pending  Hb and vitals normal

## 2015-01-19 ENCOUNTER — Encounter (HOSPITAL_COMMUNITY): Payer: Self-pay | Admitting: Urology

## 2015-01-19 LAB — GLUCOSE, CAPILLARY
GLUCOSE-CAPILLARY: 139 mg/dL — AB (ref 70–99)
Glucose-Capillary: 151 mg/dL — ABNORMAL HIGH (ref 70–99)
Glucose-Capillary: 119 mg/dL — ABNORMAL HIGH (ref 70–99)

## 2015-01-19 LAB — CBC
HCT: 35.9 % — ABNORMAL LOW (ref 39.0–52.0)
HEMOGLOBIN: 11.8 g/dL — AB (ref 13.0–17.0)
MCH: 33.6 pg (ref 26.0–34.0)
MCHC: 32.9 g/dL (ref 30.0–36.0)
MCV: 102.3 fL — ABNORMAL HIGH (ref 78.0–100.0)
Platelets: 118 10*3/uL — ABNORMAL LOW (ref 150–400)
RBC: 3.51 MIL/uL — AB (ref 4.22–5.81)
RDW: 12.8 % (ref 11.5–15.5)
WBC: 9.9 10*3/uL (ref 4.0–10.5)

## 2015-01-19 LAB — BASIC METABOLIC PANEL
ANION GAP: 5 (ref 5–15)
BUN: 11 mg/dL (ref 6–23)
CHLORIDE: 110 mmol/L (ref 96–112)
CO2: 29 mmol/L (ref 19–32)
Calcium: 7.4 mg/dL — ABNORMAL LOW (ref 8.4–10.5)
Creatinine, Ser: 0.77 mg/dL (ref 0.50–1.35)
GFR calc Af Amer: >90 mL/min (ref >90)
Glucose, Bld: 137 mg/dL — ABNORMAL HIGH (ref 70–99)
Potassium: 3.4 mmol/L — ABNORMAL LOW (ref 3.5–5.1)
Sodium: 144 mmol/L (ref 135–145)

## 2015-01-19 MED ORDER — IBUPROFEN 200 MG PO TABS
200.0000 mg | ORAL_TABLET | Freq: Two times a day (BID) | ORAL | Status: DC
Start: 2015-01-19 — End: 2015-01-22
  Administered 2015-01-19 – 2015-01-22 (×7): 200 mg via ORAL
  Filled 2015-01-19 (×8): qty 1

## 2015-01-19 MED ORDER — PANTOPRAZOLE SODIUM 40 MG PO TBEC
40.0000 mg | DELAYED_RELEASE_TABLET | Freq: Every day | ORAL | Status: DC
  Administered 2015-01-19 – 2015-01-22 (×4): 40 mg via ORAL
  Filled 2015-01-19 (×4): qty 1

## 2015-01-19 MED ORDER — ALUM & MAG HYDROXIDE-SIMETH 200-200-20 MG/5ML PO SUSP
30.0000 mL | ORAL | Status: DC | PRN
  Administered 2015-01-19 – 2015-01-20 (×3): 30 mL via ORAL
  Filled 2015-01-19 (×3): qty 30

## 2015-01-19 NOTE — Consult Note (Signed)
WOC ostomy consult note Stoma type/location: RUQ ileal conduit with two stents intact, Red = Right, Blue = Left. Inspection of previous skin barrier reveals that patient's system is about to leak. Stomal assessment/size: 1 and 3/8 inches round, with abdomen at rest, stomal appearance is slightly oval.  Os at center, stoma red, moist with mild elevation Peristomal assessment: clear.  Some minor disruption in epidermis from 7-9 o'clock. Treatment options for stomal/peristomal skin: none Output: clear, amber urine Ostomy pouching: 1pc. Convex urostomy pouch with skin barrier ring Education provided: Patient and wife are anxious about stoma placement and whether or not pouch will show when patient is wearing shorts[place]  [place] They are reminded that this is an initial pouching system and that if changes can be made they will be. It is noted that patient's abdomen is large and rotund. Patient observes stoma using a mirror. [facility] nursing team will not follow, but will remain available to this patient, the nursing and medical team.  Please re-consult if needed. Thanks, Maudie Flakes, MSN, RN, [place], [place], [place] [phone])

## 2015-01-19 NOTE — Evaluation (Signed)
Occupational Therapy Evaluation Patient Details Name: Mario Reese MRN: [id number] DOB: [birth year] Today's Date: 01/19/2015    History of Present Illness 69 yo male adm 01/15/15 with bladder CA -->s/p ROBOTIC ASSISTED CYSTOPROSTATECTOMY AND URETHRECTOMY  WITH CONDUIT  on 01/16/15; PMHx  HTN, DM, former smoker   Clinical Impression   Pt limited by pain and fatigue this session. He did tolerate sitting up at edge of chair for bathing task but did begin to hurt more toward the end of bathing task. He is motivated and feel he will benefit from OT to progress ADL independence for d/c home with significant other assisting.     Follow Up Recommendations  Home health OT;Supervision/Assistance - 24 hour    Equipment Recommendations  3 in 1 bedside comode (pt has a riser, may not fit weight capacity)    Recommendations for Other Services       Precautions / Restrictions Precautions Precaution Comments: multiple lines, JP drain Restrictions Weight Bearing Restrictions: No      Mobility Bed Mobility                  Transfers                 General transfer comment: pt declined standing with OT this visit due to pain/fatigue.    Balance                                            ADL Overall ADL's : Needs assistance/impaired Eating/Feeding: Independent Eating/Feeding Details (indicate cue type and reason): independent with liquid diet Grooming: Wash/dry face;Set up;Sitting   Upper Body Bathing: Minimal assitance;Sitting Upper Body Bathing Details (indicate cue type and reason): due to lines/tubes Lower Body Bathing: Sitting/lateral leans;Maximal assistance Lower Body Bathing Details (indicate cue type and reason): pt declined standing with OT due to fatigue. washed seated aspects. needs assist for lower legs and feet.  Upper Body Dressing : Minimal assistance;Sitting   Lower Body Dressing: Total assistance;Sitting/lateral leans Lower Body  Dressing Details (indicate cue type and reason): with socks. unable to cross LEs up.    Toilet Transfer Details (indicate cue type and reason): pt declined standing this session. states up earlier to transfer to wheelchair to come to 4th floor and he is fatigued and hurting more.           General ADL Comments: Discussed AE options for LB self care. Pt states his girlfriend will be at home with him also but willing to consider AE options also. Pt with increased pain with sitting upright in chair for bath and reaching to bathe UEs.      Vision                     Perception     Praxis      Pertinent Vitals/Pain Pain Assessment: 0-10 Pain Score: 6  Pain Location: abdomen Pain Descriptors / Indicators: Aching Pain Intervention(s): Repositioned;PCA encouraged     Hand Dominance     Extremity/Trunk Assessment Upper Extremity Assessment Upper Extremity Assessment: Overall WFL for tasks assessed           Communication Communication Communication: No difficulties   Cognition Arousal/Alertness: Awake/alert Behavior During Therapy: WFL for tasks assessed/performed Overall Cognitive Status: Within Functional Limits for tasks assessed  General Comments       Exercises       Shoulder Instructions      Home Living Family/patient expects to be discharged to:: Private residence Living Arrangements: Spouse/significant other Available Help at Discharge: Family Type of Home: House Home Access: Level entry     Home Layout: One level     Bathroom Shower/Tub: Tub/shower unit;Walk-in shower   Bathroom Toilet: Standard     Home Equipment: Cane - single point;Toilet riser          Prior Functioning/Environment Level of Independence: Independent             OT Diagnosis: Generalized weakness;Acute pain   OT Problem List: Decreased strength;Decreased knowledge of use of DME or AE;Pain   OT Treatment/Interventions:  Self-care/ADL training;Patient/family education;Therapeutic activities;DME and/or AE instruction    OT Goals(Current goals can be found in the care plan section) Acute Rehab OT Goals Patient Stated Goal: reduce pain OT Goal Formulation: With patient Time For Goal Achievement: 02/02/15 Potential to Achieve Goals: Good  OT Frequency: Min 2X/week   Barriers to D/C:            Co-evaluation              End of Session    Activity Tolerance: Patient limited by pain;Patient limited by fatigue Patient left: in chair   Time: 1202-1240 OT Time Calculation (min): 38 min Charges:  OT General Charges $OT Visit: 1 Procedure OT Evaluation $Initial OT Evaluation Tier I: 1 Procedure OT Treatments $Self Care/Home Management : 8-22 mins $Therapeutic Activity: 8-22 mins G-Codes:    Jules Schick  [phone] 01/19/2015, 1:16 PM

## 2015-01-19 NOTE — Progress Notes (Signed)
3 Days Post-Op  Subjective:  1 - Bladder and Urethral Cancer - s/p robotic cystoprostatectomy with bilateral sentinal + template lymphadenectomy and total urethectomy  01/16/15. Path pending. Observed in stepdown initially ppost-op. ON-Q pain catheters removed form abd and perineal incisions 2/1.  2 - Post-op Ileus - s/p ileal conduit urinary diversion as part of cystectomy.  Bgan clears 1/31.  3 - Disposition / Rehab - PT, OT, Ostomy RN consulted and following. Pt lived independantly [facility].  Today "Mario Reese" is w/o compliants. Ambulated yesterday. Pain controlled. WEaned to [place] only.  Objective: Vital signs in last 24 hours: Temp:  [98 F (36.7 C)-98.9 F (37.2 C)] 98.5 F (36.9 C) (02/01 0400) Pulse Rate:  [66-85] 74 (02/01 0400) Resp:  [13-27] 26 (02/01 0400) BP: (118-145)/(58-91) 138/71 mmHg (02/01 0400) SpO2:  [90 %-94 %] 91 % (02/01 0400) FiO2 (%):  [3 %-92 %] 3 % (01/31 1555) Weight:  [116.5 kg (256 lb 13.4 oz)] 116.5 kg (256 lb 13.4 oz) (02/01 0400) Last BM Date: 01/15/15  Intake/Output from previous day: 01/31 0701 - 02/01 0700 In: 2586.3 [P.O.:180; I.V.:2256.3; IV Piggyback:150] Out: 2830 [Drains:295; Stool:2535] Intake/Output this shift:    General appearance: alert, cooperative, appears stated age and family at bedside Head: Normocephalic, without obvious abnormality, atraumatic Eyes: negative Nose: Nares normal. Septum midline. Mucosa normal. No drainage or sinus tenderness. Throat: lips, mucosa, and tongue normal; teeth and gums normal Neck: supple, symmetrical, trachea midline Back: symmetric, no curvature. ROM normal. No CVA tenderness. Resp: non-labored on 2L [place] O2 Cardio: Nl rate GI: soft, non-tender; bowel sounds normal; no masses,  no organomegaly Male genitalia: normal, penoscrotal ecchymoses w/o hematoma as expected. NO drainage.  Extremities: extremities normal, atraumatic, no cyanosis or edema Pulses: 2+ and symmetric Skin: Skin color, texture,  turgor normal. No rashes or lesions Lymph nodes: Cervical, supraclavicular, and axillary nodes normal. Neurologic: Grossly normal Incision/Wound: RLQ Urostomy pink / patent with Bander stents in place. Abdominal and perineal incisions c/d/i. ONQ pain catheters removed form abd and perineum.   Lab Results:   Recent Labs  01/18/15 0600 01/19/15 0358  WBC 12.9* 9.9  HGB 12.7* 11.8*  HCT 37.3* 35.9*  PLT 113* 118*   BMET  Recent Labs  01/18/15 0600 01/19/15 0358  NA 140 144  K 3.4* 3.4*  CL 105 110  CO2 29 29  GLUCOSE 144* 137*  BUN 11 11  CREATININE 0.87 0.77  CALCIUM 7.5* 7.4*   PT/INR No results for input(s): LABPROT, INR in the last 72 hours. ABG No results for input(s): PHART, HCO3 in the last 72 hours.  Invalid input(s): PCO2, PO2  Studies/Results: No results found.  Anti-infectives: Anti-infectives    Start     Dose/Rate Route Frequency Ordered Stop   01/16/15 2200  piperacillin-tazobactam (ZOSYN) IVPB 3.375 g     3.375 g12.5 mL/hr over 240 Minutes Intravenous Every 8 hours 01/16/15 1804     01/16/15 0600  piperacillin-tazobactam (ZOSYN) IVPB 3.375 g     3.375 g100 mL/hr over 30 Minutes Intravenous 30 min pre-op 01/15/15 1140 01/16/15 1417      Assessment/Plan: s 1 - Bladder and Urethral Cancer - Doing well POD 3. Transfer to floor. On-Q pain catheters removed.   2 - Post-op Ileus - continue clears until +flatus or BM  3 - Disposition / Rehab - appreciate PT, OT, Ostomy RN, final dispon pending.   [facility], Danney Bungert 01/19/2015

## 2015-01-20 LAB — GLUCOSE, CAPILLARY
Glucose-Capillary: 128 mg/dL — ABNORMAL HIGH (ref 70–99)
Glucose-Capillary: 139 mg/dL — ABNORMAL HIGH (ref 70–99)
Glucose-Capillary: 118 mg/dL — ABNORMAL HIGH (ref 70–99)
Glucose-Capillary: 136 mg/dL — ABNORMAL HIGH (ref 70–99)
Glucose-Capillary: 151 mg/dL — ABNORMAL HIGH (ref 70–99)

## 2015-01-20 LAB — CREATININE, FLUID (PLEURAL, PERITONEAL, JP DRAINAGE): CREAT FL: 0.8 mg/dL

## 2015-01-20 MED ORDER — DOCUSATE SODIUM 100 MG PO CAPS
100.0000 mg | ORAL_CAPSULE | Freq: Two times a day (BID) | ORAL | Status: DC
  Administered 2015-01-20 – 2015-01-22 (×4): 100 mg via ORAL
  Filled 2015-01-20 (×7): qty 1

## 2015-01-20 MED ORDER — SENNA 8.6 MG PO TABS
1.0000 | ORAL_TABLET | Freq: Every day | ORAL | Status: DC
  Administered 2015-01-20 – 2015-01-22 (×3): 8.6 mg via ORAL
  Filled 2015-01-20 (×3): qty 1

## 2015-01-20 MED ORDER — OXYCODONE HCL 5 MG PO TABS
5.0000 mg | ORAL_TABLET | ORAL | Status: DC | PRN
  Administered 2015-01-20 – 2015-01-21 (×4): 5 mg via ORAL
  Filled 2015-01-20 (×4): qty 1

## 2015-01-20 MED ORDER — HYDROMORPHONE HCL 1 MG/ML IJ SOLN
0.5000 mg | INTRAMUSCULAR | Status: DC | PRN
  Administered 2015-01-20 – 2015-01-21 (×5): 0.5 mg via INTRAVENOUS
  Filled 2015-01-20 (×5): qty 1

## 2015-01-20 NOTE — Progress Notes (Signed)
4 Days Post-Op  Subjective:  1 - Bladder and Urethral Cancer - s/p robotic cystoprostatectomy with bilateral sentinal + template lymphadenectomy and total urethectomy  01/16/15. Path pending. Observed in stepdown initially ppost-op. ON-Q pain catheters removed form abd and perineal incisions 2/1.  2 - Post-op Ileus - s/p ileal conduit urinary diversion as part of cystectomy.  Bgan clears 1/31. Flatus 2/1. Reg diet 2/2.  3 - Disposition / Rehab - PT, OT, Ostomy RN consulted and following. Pt lived independantly [facility].  Today "Mario Reese" is w/o compliants. Ambulated yesterday. Pain controlled. Passed flatus. Tolerated clears without nausea.  Objective: Vital signs in last 24 hours: Temp:  [98.1 F (36.7 C)-98.5 F (36.9 C)] 98.1 F (36.7 C) (02/02 0610) Pulse Rate:  [69-85] 78 (02/02 0610) Resp:  [13-22] 13 (02/02 0800) BP: (123-151)/(61-76) 151/67 mmHg (02/02 0610) SpO2:  [91 %-95 %] 93 % (02/02 0800) Last BM Date: 02/15/15  Intake/Output from previous day: 02/01 0701 - 02/02 0700 In: 2152.5 [P.O.:840; I.V.:1150; IV Piggyback:62.5] Out: 2115 [Drains:390; NOBSJ:6283] Intake/Output this shift: Total I/O In: -  Out: 61 [Drains:45]  General appearance: alert, cooperative, appears stated age and family at bedside Head: Normocephalic, without obvious abnormality, atraumatic Eyes: negative Nose: Nares normal. Septum midline. Mucosa normal. No drainage or sinus tenderness. Throat: lips, mucosa, and tongue normal; teeth and gums normal Neck: supple, symmetrical, trachea midline Back: symmetric, no curvature. ROM normal. No CVA tenderness. Resp: non-labored on 2L [place] O2 Cardio: Nl rate GI: soft, non-tender; bowel sounds normal; no masses,  no organomegaly Male genitalia: normal, penoscrotal ecchymoses w/o hematoma as expected. NO drainage.  Extremities: extremities normal, atraumatic, no cyanosis or edema Pulses: 2+ and symmetric Skin: Skin color, texture, turgor normal. No rashes or  lesions Lymph nodes: Cervical, supraclavicular, and axillary nodes normal. Neurologic: Grossly normal Incision/Wound: RLQ Urostomy pink / patent with Bander stents in place. Abdominal and perineal incisions c/d/i. ONQ pain catheters removed form abd and perineum.   Lab Results:   Recent Labs  01/18/15 0600 01/19/15 0358  WBC 12.9* 9.9  HGB 12.7* 11.8*  HCT 37.3* 35.9*  PLT 113* 118*   BMET  Recent Labs  01/18/15 0600 01/19/15 0358  NA 140 144  K 3.4* 3.4*  CL 105 110  CO2 29 29  GLUCOSE 144* 137*  BUN 11 11  CREATININE 0.87 0.77  CALCIUM 7.5* 7.4*   PT/INR No results for input(s): LABPROT, INR in the last 72 hours. ABG No results for input(s): PHART, HCO3 in the last 72 hours.  Invalid input(s): PCO2, PO2  Studies/Results: No results found.  Anti-infectives: Anti-infectives    Start     Dose/Rate Route Frequency Ordered Stop   01/16/15 2200  piperacillin-tazobactam (ZOSYN) IVPB 3.375 g     3.375 g12.5 mL/hr over 240 Minutes Intravenous Every 8 hours 01/16/15 1804     01/16/15 0600  piperacillin-tazobactam (ZOSYN) IVPB 3.375 g     3.375 g100 mL/hr over 30 Minutes Intravenous 30 min pre-op 01/15/15 1140 01/16/15 1417      Assessment/Plan: s 1 - Bladder and Urethral Cancer - Doing well POD 4.   2 - Post-op Ileus - resolved. Start REG diet, PO pain meds, MLIVF, colace/senna.  3 - Disposition / Rehab - appreciate PT, OT, Ostomy RN, final dispon pending.   [name] 01/20/2015

## 2015-01-20 NOTE — Progress Notes (Signed)
PT Cancellation Note  Patient Details Name: Mario Reese MRN: [id number] DOB: 22-Jun-1946   Cancelled Treatment:    Reason Eval/Treat Not Completed:  (patient has walked once today, waiting for IV to run. attempted x 2 by PT. is doing well per CNA.)   Claretha Cooper 01/20/2015, 3:44 PM

## 2015-01-21 LAB — GLUCOSE, CAPILLARY
GLUCOSE-CAPILLARY: 148 mg/dL — AB (ref 70–99)
GLUCOSE-CAPILLARY: 158 mg/dL — AB (ref 70–99)
Glucose-Capillary: 119 mg/dL — ABNORMAL HIGH (ref 70–99)
Glucose-Capillary: 140 mg/dL — ABNORMAL HIGH (ref 70–99)

## 2015-01-21 MED ORDER — OXYCODONE HCL 5 MG PO TABS
10.0000 mg | ORAL_TABLET | ORAL | Status: DC | PRN
  Administered 2015-01-21 – 2015-01-22 (×7): 10 mg via ORAL
  Filled 2015-01-21 (×7): qty 2

## 2015-01-21 MED ORDER — ACETAMINOPHEN 500 MG PO TABS
1000.0000 mg | ORAL_TABLET | Freq: Three times a day (TID) | ORAL | Status: DC
  Administered 2015-01-21 – 2015-01-22 (×4): 1000 mg via ORAL
  Filled 2015-01-21 (×4): qty 2

## 2015-01-21 NOTE — Consult Note (Signed)
WOC ostomy follow up:  Extended teaching session. Significant other present for session. Stoma type/location: RUQ ileal conduit with two stents intact.  Red = right ureter, Blue = left ureter Stomal assessment/size: presents more oval today: 1 and 1/8 inch x 1/2 inch  Peristomal assessment: intact in the immediate peristomal plane, two intact, blood-filled tension blisters on either side of the skin barrier measuring 3cm x 0.6cm. Mild crease in the 2 o'clock area (toward umbilicus) Treatment options for stomal/peristomal skin: skin barrier ring to fill defect.  Hydrocolloid dressing placed over tension blister. Output amber colored urine Ostomy pouching: 1pc convex urostomy pouch with skin barrier ring  Education provided: Extended session for teaching pertaining to connecting and disconnecting from bedside urinary drainage bag. Patient able to provide  return demonstration of attaching and detaching from urinary drainage bag. Care and cleansing of bedside urinary drainage bag taught with good understanding.  Linus Mako this afternoon in hallway without being attached to bedside urinary drainage bag to learn sensation of pouch filling and to empty urine in toilet when 1/3 to 1/2 full. Demonstrates closing and opening of drainage port on pouch.  Patient and significant other taught about the presence of mucus in the urine being normal for someone with an ileal conduit and the proper way to provide urine samples if one required by PCP. Significant other observes pouch preparation using pattern, taught about need for resizing of stoma routinely for the first 4-6 weeks and then periodically thereafter. Patient not currently able to change pouch as stoma visualization is difficult in thge reclining position.  Practice will be required over several weeks using bathroom mirror.  Patient taught that he may shower with or without the pouch.  Antireflux feature of pouch taught and demonstrated. The writer places pouch  over stoma today to feed stents through aperture.  Significant other peels away tape backing.  Next pouch change is due on Saturday. Patient still with questions and may require continued support-from, but not only from [facility], but also [place] nurse post discharge.  I will try to coordinate this with patient on day of RTC visit with Dr. Tresa Moore. Enrolled patient in [facility] Start Discharge program: Yes [place] nursing team will follow, and will remain available to this patient, the nursing, surgical and medical teams.   Thanks, Maudie Flakes, MSN, RN, [place], [place], [place] [phone])

## 2015-01-21 NOTE — Progress Notes (Signed)
Physical Therapy Treatment Patient Details Name: Mario Reese MRN: [id number] DOB: 23-Dec-1945 Today's Date: 01/21/2015    History of Present Illness 69 yo male adm 01/15/15 with bladder CA -->s/p ROBOTIC ASSISTED CYSTOPROSTATECTOMY AND URETHRECTOMY  WITH CONDUIT  on 01/16/15; PMHx  HTN, DM, former smoker    PT Comments    Pt progressing well; possible D/C in the next day or two per Dr. Tresa Moore; will  Continue follow, may begin to work on amb without RW next session  Follow Up Recommendations  Home health PT;Supervision - Intermittent     Equipment Recommendations  Rolling walker with 5" wheels    Recommendations for Other Services       Precautions / Restrictions Restrictions Weight Bearing Restrictions: No    Mobility  Bed Mobility Overal bed mobility: Needs Assistance Bed Mobility: Supine to Sit     Supine to sit: Min guard (partial roll)     General bed mobility comments: pt performs partail roll, uses bed rail, HOB at 45*, min/guard for safety  Transfers Overall transfer level: Needs assistance Equipment used: Rolling walker (2 wheeled) Transfers: Sit to/from Stand Sit to Stand: Min guard;Supervision         General transfer comment: repeated x 3 for instruction, bed ht left at lowest setting   Ambulation/Gait Ambulation/Gait assistance: Supervision;Min guard Ambulation Distance (Feet): 400 Feet Assistive device: Rolling walker (2 wheeled) Gait Pattern/deviations: Step-through pattern;Decreased stride length     General Gait Details: cues for posture   Stairs            Wheelchair Mobility    Modified Rankin (Stroke Patients Only)       Balance                                    Cognition Arousal/Alertness: Awake/alert Behavior During Therapy: WFL for tasks assessed/performed Overall Cognitive Status: Within Functional Limits for tasks assessed                      Exercises      General Comments         Pertinent Vitals/Pain Pain Assessment: 0-10 Pain Score: 2  Pain Location: abd Pain Descriptors / Indicators: Sore Pain Intervention(s): Limited activity within patient's tolerance;Monitored during session;Premedicated before session    Home Living                      Prior Function            PT Goals (current goals can now be found in the care plan section) Acute Rehab PT Goals Patient Stated Goal:   to walk better, back to I  PT Goal Formulation: With patient Time For Goal Achievement: 02/01/15 Potential to Achieve Goals: Good Progress towards PT goals: Progressing toward goals    Frequency  Min 3X/week    PT Plan Current plan remains appropriate    Co-evaluation             End of Session Equipment Utilized During Treatment: Gait belt Activity Tolerance: Patient tolerated treatment well Patient left: with call bell/phone within reach;with family/visitor present;Other (comment) (in bathroom with significant other)     Time: 4332-9518 PT Time Calculation (min) (ACUTE ONLY): 25 min  Charges:  $Gait Training: 23-37 mins                    G Codes:  [facility] 01/21/2015, 4:55 PM

## 2015-01-21 NOTE — Progress Notes (Signed)
5 Days Post-Op  Subjective:  1 - Bladder and Urethral Cancer - s/p robotic cystoprostatectomy with bilateral sentinal + template lymphadenectomy and total urethectomy  01/16/15 for [id number] urthelial carcinoma of bladder-urethra.  Observed in stepdown initially ppost-op. ON-Q pain catheters removed form abd and perineal incisions 2/1. JP removed 2/3 as Cr form fluid same as serum.  2 - Post-op Ileus - s/p ileal conduit urinary diversion as part of cystectomy.  Bgan clears 1/31. Flatus 2/1. Reg diet and formed BM 2/2.  3 - Disposition / Rehab - PT, OT, Ostomy RN consulted and following. [facility].  Today "Mario Reese" continues to progress. Much better ambulating. Still req PO pain meds as expected. Had formed BM yesterday.   Objective: Vital signs in last 24 hours: Temp:  [98.1 F (36.7 C)-98.2 F (36.8 C)] 98.1 F (36.7 C) (02/03 0536) Pulse Rate:  [71-77] 71 (02/03 0536) Resp:  [18] 18 (02/03 0536) BP: (139-157)/(74-79) 154/74 mmHg (02/03 0536) SpO2:  [92 %-99 %] 99 % (02/03 0536) Last BM Date: 02/15/15  Intake/Output from previous day: 02/02 0701 - 02/03 0700 In: 71 [P.O.:840; IV Piggyback:50] Out: 1225 [Drains:625; Stool:600] Intake/Output this shift:    General appearance: alert, appears stated age and family at bedside Eyes: negative Nose: Nares normal. Septum midline. Mucosa normal. No drainage or sinus tenderness. Throat: lips, mucosa, and tongue normal; teeth and gums normal Neck: supple, symmetrical, trachea midline Back: symmetric, no curvature. ROM normal. No CVA tenderness. Resp: non-labored on room air Cardio: Nl rate GI: soft, non-tender; bowel sounds normal; no masses,  no organomegaly Extremities: extremities normal, atraumatic, no cyanosis or edema Pulses: 2+ and symmetric Skin: Skin color, texture, turgor normal. No rashes or lesions Lymph nodes: Cervical, supraclavicular, and axillary nodes normal. Neurologic: Grossly  normal Incision/Wound: RLQ urostomy pink / patent with bander stents in situe. JP removed and dry dressing applied. Midline, port,and perineal incisions c/d/i. Stable scrotal ecchymoses w/o hematoma.  Lab Results:   Recent Labs  01/19/15 0358  WBC 9.9  HGB 11.8*  HCT 35.9*  PLT 118*   BMET  Recent Labs  01/19/15 0358  NA 144  K 3.4*  CL 110  CO2 29  GLUCOSE 137*  BUN 11  CREATININE 0.77  CALCIUM 7.4*   PT/INR No results for input(s): LABPROT, INR in the last 72 hours. ABG No results for input(s): PHART, HCO3 in the last 72 hours.  Invalid input(s): PCO2, PO2  Studies/Results: No results found.  Anti-infectives: Anti-infectives    Start     Dose/Rate Route Frequency Ordered Stop   01/16/15 2200  piperacillin-tazobactam (ZOSYN) IVPB 3.375 g  Status:  Discontinued     3.375 g12.5 mL/hr over 240 Minutes Intravenous Every 8 hours 01/16/15 1804 01/21/15 0816   01/16/15 0600  piperacillin-tazobactam (ZOSYN) IVPB 3.375 g     3.375 g100 mL/hr over 30 Minutes Intravenous 30 min pre-op 01/15/15 1140 01/16/15 1417      Assessment/Plan:  1 - Bladder and Urethral Cancer - Doing well POD 5, path explained to pt and family in detail. Saline lock, stop ABX.  2 - Post-op Ileus - resolved. Continue diabetic diet.   3 - Disposition / Rehab - appreciate PT, OT, Ostomy RN, final dispon pending. Target discharge 2/4 or 2/5 based on current progress.   [facility], Tanya Marvin 01/21/2015

## 2015-01-22 LAB — GLUCOSE, CAPILLARY
Glucose-Capillary: 110 mg/dL — ABNORMAL HIGH (ref 70–99)
Glucose-Capillary: 153 mg/dL — ABNORMAL HIGH (ref 70–99)

## 2015-01-22 MED ORDER — OXYCODONE-ACETAMINOPHEN 5-325 MG PO TABS: 1.0000 | ORAL_TABLET | Freq: Four times a day (QID) | ORAL | Status: AC | PRN

## 2015-01-22 MED ORDER — SENNA-DOCUSATE SODIUM 8.6-50 MG PO TABS: 1.0000 | ORAL_TABLET | Freq: Two times a day (BID) | ORAL | Status: AC

## 2015-01-22 NOTE — Discharge Summary (Signed)
Physician Discharge Summary  Patient ID: Mario Reese MRN: [id number] DOB/AGE: 1946-04-28 69 y.o.  Admit date: 01/15/2015 Discharge date: 01/22/2015  Admission Diagnoses: Bladder Cancer  Discharge Diagnoses:  Active Problems:   Bladder cancer Urethral Cancer  Discharged Condition: good  Hospital Course:   1 - Bladder and Urethral Cancer - s/p robotic cystoprostatectomy with bilateral sentinal + template lymphadenectomy and total urethectomy 01/16/15 for [id number] urthelial carcinoma of bladder-urethra. Observed in stepdown initially ppost-op. ON-Q pain catheters removed form abd and perineal incisions 2/1. JP removed 2/3 as Cr form fluid same as serum.  2 - Post-op Ileus - s/p ileal conduit urinary diversion as part of cystectomy. Bgan clears 1/31. Flatus 2/1. Reg diet and formed BM 2/2.  3 - Disposition / Rehab - PT, OT, Ostomy RN consulted. PT/OT rec rolling walker and toilet riser which have been supplied at discharge. HHRN arranged for new ostomy teaching and supplies. By POD 6, the day of discharge, pt is ambulaotry, tollerating regular diet with resumed bowel function, pain controlled on PO meds and felt to be adequate for discharge.    Consults: PT, OR, Ostomy RN  Significant Diagnostic Studies: labs: as per above  Treatments: surgery:  robotic cystoprostatectomy with bilateral sentinal + template lymphadenectomy and total urethectomy 01/16/15   Discharge Exam: Blood pressure 143/64, pulse 70, temperature 98.3 F (36.8 C), temperature source Oral, resp. rate 18, height 6\' 2"  (1.88 m), weight 116.5 kg (256 lb 13.4 oz), SpO2 92 %. General appearance: alert, cooperative, appears stated age and family at bedside Eyes: negative Nose: Nares normal. Septum midline. Mucosa normal. No drainage or sinus tenderness. Throat: lips, mucosa, and tongue normal; teeth and gums normal Neck: supple, symmetrical, trachea midline Back: symmetric, no curvature. ROM normal. No CVA  tenderness. Resp: no-labored on room air Cardio: Nl rate GI: soft, non-tender; bowel sounds normal; no masses,  no organomegaly Male genitalia: changes s/p urethrectomy as expected. stable penoscrotla ecchymoses w/o hematoma of drainage.  Extremities: extremities normal, atraumatic, no cyanosis or edema Pulses: 2+ and symmetric Skin: Skin color, texture, turgor normal. No rashes or lesions Lymph nodes: Cervical, supraclavicular, and axillary nodes normal. Neurologic: Grossly normal Incision/Wound: RLQ Urostomy pink and patent with bander stents in situ. Recetn midline and perineal incision sites c/d/i.   Disposition: 01-Home or Self Care     Medication List    STOP taking these medications        finasteride 5 MG tablet  Commonly known as:  PROSCAR      TAKE these medications        amLODipine 10 MG tablet  Commonly known as:  NORVASC  Take 10 mg by mouth every morning.     aspirin EC 81 MG tablet  Take 81 mg by mouth every morning.     Fish Oil 1000 MG Caps  Take 2 capsules by mouth every morning.     glimepiride 2 MG tablet  Commonly known as:  AMARYL  Take 2 mg by mouth daily with breakfast.     ibuprofen 200 MG tablet  Commonly known as:  ADVIL,MOTRIN  Take 400 mg by mouth daily as needed for fever, headache, moderate pain or cramping. Takes every morning     lisinopril-hydrochlorothiazide 20-12.5 MG per tablet  Commonly known as:  PRINZIDE,ZESTORETIC  Take 2 tablets by mouth daily with breakfast.     metformin 500 MG (OSM) 24 hr tablet  Commonly known as:  FORTAMET  Take 2,000 mg by mouth daily with breakfast.  metoprolol 200 MG 24 hr tablet  Commonly known as:  TOPROL-XL  Take 200 mg by mouth daily with breakfast.     multivitamin with minerals Tabs tablet  Take 1 tablet by mouth daily.     omeprazole 20 MG capsule  Commonly known as:  PRILOSEC  Take 20 mg by mouth every morning.     oxyCODONE-acetaminophen 5-325 MG per tablet  Commonly known  as:  ROXICET  Take 1-2 tablets by mouth every 6 (six) hours as needed for moderate pain or severe pain. Post-operatively     pravastatin 40 MG tablet  Commonly known as:  PRAVACHOL  Take 40 mg by mouth every morning.     sennosides-docusate sodium 8.6-50 MG tablet  Commonly known as:  SENOKOT-S  Take 1 tablet by mouth 2 (two) times daily. As needed while taking pain meds to prevent constipation     sulfamethoxazole-trimethoprim 400-80 MG per tablet  Commonly known as:  BACTRIM  Take 1 tablet by mouth 2 (two) times daily. X 3 days to prevent infection     vitamin B-12 1000 MCG tablet  Commonly known as:  CYANOCOBALAMIN  Take 1,000 mcg by mouth every morning.         SignedAlexis Frock 01/22/2015, 12:54 PM

## 2015-01-22 NOTE — Progress Notes (Signed)
OT Cancellation Note  Patient Details Name: Mario Reese MRN: [id number] DOB: 19-Jan-1946   Cancelled Treatment:    Reason Eval/Treat Not Completed: Other (comment) Spoke to pt and significant other. She assisted PRN with getting pt dressed this am. He has used the bathroom with 3in1 multiple times and he reports feeling comfortable with how to use 3in1. He states he did a shower and she was present with no concerns for showering either. They decline needing HHOT-informed case Freight forwarder. Pt supposed to d/c today.  [place], [place] 01/22/2015, 1:00 PM

## 2015-01-22 NOTE — Discharge Instructions (Signed)

## 2015-01-22 NOTE — Consult Note (Signed)
WOC ostomy follow up Stoma type/location: RUQ ileal conduit with 2 stents intact. Stomal assessment/size: 1 and 1/8 inch x 1 and 1/2 inch oval Peristomal assessment: Not seen today Treatment options for stomal/peristomal skin: Skin barrier ring Output amber, clear urine Ostomy pouching: 1pc. Convex pouching system with skin barrier ring.  Education provided: Patient and significant have several other questions and all are answered to their satisfaction this morning. Will require practice emptying today in anticipation of discharge either today or tomorrow. Enrolled patient in [facility] Discharge program: Yes Ready for discharge to a local residence and the care of competent providers as well as followup by Dr. Tresa Moore.  I would like to see this patient in conjunction with Dr. Tresa Moore in two weeks to assess for changes in the peristomal plane and stoma. Thanks, Maudie Flakes, MSN, RN, [place], [place], [place] [phone])

## 2015-01-26 ENCOUNTER — Inpatient Hospital Stay (HOSPITAL_COMMUNITY): Payer: Medicare Other

## 2015-01-26 ENCOUNTER — Emergency Department (HOSPITAL_COMMUNITY): Payer: Medicare Other

## 2015-01-26 ENCOUNTER — Encounter (HOSPITAL_COMMUNITY): Payer: Self-pay | Admitting: *Deleted

## 2015-01-26 ENCOUNTER — Inpatient Hospital Stay (HOSPITAL_COMMUNITY)
Admission: EM | Admit: 2015-01-26 | Discharge: 2015-02-01 | DRG: 389 | Disposition: A | Discharge status: Home Health | Payer: Medicare Other | Attending: Urology | Admitting: Urology

## 2015-01-26 DIAGNOSIS — E785 Hyperlipidemia, unspecified: Secondary | ICD-10-CM | POA: Diagnosis present

## 2015-01-26 DIAGNOSIS — Z7982 Long term (current) use of aspirin: Secondary | ICD-10-CM | POA: Diagnosis not present

## 2015-01-26 DIAGNOSIS — I10 Essential (primary) hypertension: Secondary | ICD-10-CM | POA: Diagnosis present

## 2015-01-26 DIAGNOSIS — Z932 Ileostomy status: Secondary | ICD-10-CM

## 2015-01-26 DIAGNOSIS — E119 Type 2 diabetes mellitus without complications: Secondary | ICD-10-CM | POA: Diagnosis present

## 2015-01-26 DIAGNOSIS — Y838 Other surgical procedures as the cause of abnormal reaction of the patient, or of later complication, without mention of misadventure at the time of the procedure: Secondary | ICD-10-CM | POA: Diagnosis present

## 2015-01-26 DIAGNOSIS — Z6833 Body mass index (BMI) 33.0-33.9, adult: Secondary | ICD-10-CM | POA: Diagnosis not present

## 2015-01-26 DIAGNOSIS — Z791 Long term (current) use of non-steroidal anti-inflammatories (NSAID): Secondary | ICD-10-CM | POA: Diagnosis not present

## 2015-01-26 DIAGNOSIS — E873 Alkalosis: Secondary | ICD-10-CM | POA: Diagnosis present

## 2015-01-26 DIAGNOSIS — C679 Malignant neoplasm of bladder, unspecified: Secondary | ICD-10-CM | POA: Diagnosis present

## 2015-01-26 DIAGNOSIS — Z79891 Long term (current) use of opiate analgesic: Secondary | ICD-10-CM | POA: Diagnosis not present

## 2015-01-26 DIAGNOSIS — E669 Obesity, unspecified: Secondary | ICD-10-CM | POA: Diagnosis present

## 2015-01-26 DIAGNOSIS — R112 Nausea with vomiting, unspecified: Secondary | ICD-10-CM | POA: Diagnosis present

## 2015-01-26 DIAGNOSIS — Z79899 Other long term (current) drug therapy: Secondary | ICD-10-CM

## 2015-01-26 DIAGNOSIS — R627 Adult failure to thrive: Secondary | ICD-10-CM | POA: Diagnosis present

## 2015-01-26 DIAGNOSIS — C680 Malignant neoplasm of urethra: Secondary | ICD-10-CM | POA: Diagnosis present

## 2015-01-26 DIAGNOSIS — K219 Gastro-esophageal reflux disease without esophagitis: Secondary | ICD-10-CM | POA: Diagnosis present

## 2015-01-26 DIAGNOSIS — K567 Ileus, unspecified: Principal | ICD-10-CM | POA: Diagnosis present

## 2015-01-26 DIAGNOSIS — Z87891 Personal history of nicotine dependence: Secondary | ICD-10-CM

## 2015-01-26 DIAGNOSIS — R109 Unspecified abdominal pain: Secondary | ICD-10-CM

## 2015-01-26 DIAGNOSIS — D72829 Elevated white blood cell count, unspecified: Secondary | ICD-10-CM | POA: Diagnosis present

## 2015-01-26 DIAGNOSIS — E876 Hypokalemia: Secondary | ICD-10-CM | POA: Diagnosis present

## 2015-01-26 LAB — BASIC METABOLIC PANEL
ANION GAP: 10 (ref 5–15)
BUN: 43 mg/dL — AB (ref 6–23)
CHLORIDE: 96 mmol/L (ref 96–112)
CO2: 30 mmol/L (ref 19–32)
CREATININE: 1.25 mg/dL (ref 0.50–1.35)
Calcium: 8.6 mg/dL (ref 8.4–10.5)
GFR calc Af Amer: 66 mL/min — ABNORMAL LOW (ref >90)
GFR calc non Af Amer: 57 mL/min — ABNORMAL LOW (ref >90)
Glucose, Bld: 146 mg/dL — ABNORMAL HIGH (ref 70–99)
Potassium: 3.8 mmol/L (ref 3.5–5.1)
SODIUM: 136 mmol/L (ref 135–145)

## 2015-01-26 LAB — CBC WITH DIFFERENTIAL/PLATELET
Basophils Absolute: 0 10*3/uL (ref 0.0–0.1)
Basophils Relative: 0 % (ref 0–1)
EOS ABS: 0.1 10*3/uL (ref 0.0–0.7)
Eosinophils Relative: 1 % (ref 0–5)
HCT: 41.7 % (ref 39.0–52.0)
Hemoglobin: 14.1 g/dL (ref 13.0–17.0)
LYMPHS ABS: 0.7 10*3/uL (ref 0.7–4.0)
Lymphocytes Relative: 6 % — ABNORMAL LOW (ref 12–46)
MCH: 33.5 pg (ref 26.0–34.0)
MCHC: 33.8 g/dL (ref 30.0–36.0)
MCV: 99 fL (ref 78.0–100.0)
MONO ABS: 0.9 10*3/uL (ref 0.1–1.0)
MONOS PCT: 7 % (ref 3–12)
NEUTROS ABS: 10.5 10*3/uL — AB (ref 1.7–7.7)
Neutrophils Relative %: 86 % — ABNORMAL HIGH (ref 43–77)
Platelets: 316 10*3/uL (ref 150–400)
RBC: 4.21 MIL/uL — AB (ref 4.22–5.81)
RDW: 12.5 % (ref 11.5–15.5)
WBC: 12.2 10*3/uL — ABNORMAL HIGH (ref 4.0–10.5)

## 2015-01-26 LAB — CBC
HEMATOCRIT: 38.6 % — AB (ref 39.0–52.0)
HEMOGLOBIN: 13 g/dL (ref 13.0–17.0)
MCH: 33.5 pg (ref 26.0–34.0)
MCHC: 33.7 g/dL (ref 30.0–36.0)
MCV: 99.5 fL (ref 78.0–100.0)
Platelets: 283 10*3/uL (ref 150–400)
RBC: 3.88 MIL/uL — AB (ref 4.22–5.81)
RDW: 12.7 % (ref 11.5–15.5)
WBC: 9.2 10*3/uL (ref 4.0–10.5)

## 2015-01-26 LAB — COMPREHENSIVE METABOLIC PANEL
ALBUMIN: 2.8 g/dL — AB (ref 3.5–5.2)
ALT: 15 U/L (ref 0–53)
AST: 24 U/L (ref 0–37)
Alkaline Phosphatase: 58 U/L (ref 39–117)
Anion gap: 14 (ref 5–15)
BUN: 37 mg/dL — ABNORMAL HIGH (ref 6–23)
CO2: 27 mmol/L (ref 19–32)
CREATININE: 1.09 mg/dL (ref 0.50–1.35)
Calcium: 9.3 mg/dL (ref 8.4–10.5)
Chloride: 99 mmol/L (ref 96–112)
GFR calc non Af Amer: 67 mL/min — ABNORMAL LOW (ref >90)
GFR, EST AFRICAN AMERICAN: 78 mL/min — AB (ref >90)
GLUCOSE: 166 mg/dL — AB (ref 70–99)
Potassium: 3.8 mmol/L (ref 3.5–5.1)
Sodium: 140 mmol/L (ref 135–145)
Total Bilirubin: 1.3 mg/dL — ABNORMAL HIGH (ref 0.3–1.2)
Total Protein: 6.4 g/dL (ref 6.0–8.3)

## 2015-01-26 LAB — I-STAT CG4 LACTIC ACID, ED: LACTIC ACID, VENOUS: 2.05 mmol/L — AB (ref 0.5–2.0)

## 2015-01-26 LAB — I-STAT TROPONIN, ED: Troponin i, poc: 0.01 ng/mL (ref 0.00–0.08)

## 2015-01-26 LAB — LIPASE, BLOOD: Lipase: 29 U/L (ref 11–59)

## 2015-01-26 MED ORDER — FAMOTIDINE IN NACL 20-0.9 MG/50ML-% IV SOLN
20.0000 mg | Freq: Once | INTRAVENOUS | Status: AC
  Administered 2015-01-26: 20 mg via INTRAVENOUS
  Filled 2015-01-26: qty 50

## 2015-01-26 MED ORDER — ONDANSETRON HCL 4 MG/2ML IJ SOLN
4.0000 mg | Freq: Once | INTRAMUSCULAR | Status: AC
  Administered 2015-01-26: 4 mg via INTRAVENOUS
  Filled 2015-01-26: qty 2

## 2015-01-26 MED ORDER — IOHEXOL 300 MG/ML  SOLN
50.0000 mL | Freq: Once | INTRAMUSCULAR | Status: AC | PRN
  Administered 2015-01-26: 50 mL via ORAL

## 2015-01-26 MED ORDER — LACTATED RINGERS IV SOLN
INTRAVENOUS | Status: AC
  Administered 2015-01-26 – 2015-01-28 (×3): via INTRAVENOUS

## 2015-01-26 MED ORDER — PANTOPRAZOLE SODIUM 40 MG IV SOLR
40.0000 mg | INTRAVENOUS | Status: DC
  Administered 2015-01-26 – 2015-01-30 (×5): 40 mg via INTRAVENOUS
  Filled 2015-01-26 (×6): qty 40

## 2015-01-26 MED ORDER — FAT EMULSION 20 % IV EMUL
240.0000 mL | INTRAVENOUS | Status: AC
  Administered 2015-01-26: 240 mL via INTRAVENOUS
  Filled 2015-01-26: qty 250

## 2015-01-26 MED ORDER — LACTATED RINGERS IV SOLN
INTRAVENOUS | Status: AC
  Administered 2015-01-26: 11:00:00 via INTRAVENOUS

## 2015-01-26 MED ORDER — HEPARIN SODIUM (PORCINE) 5000 UNIT/ML IJ SOLN
5000.0000 [IU] | Freq: Three times a day (TID) | INTRAMUSCULAR | Status: DC
  Administered 2015-01-26 – 2015-02-01 (×18): 5000 [IU] via SUBCUTANEOUS
  Filled 2015-01-26 (×19): qty 1

## 2015-01-26 MED ORDER — ACETAMINOPHEN 10 MG/ML IV SOLN
1000.0000 mg | Freq: Four times a day (QID) | INTRAVENOUS | Status: AC | PRN
  Filled 2015-01-26: qty 100

## 2015-01-26 MED ORDER — INSULIN ASPART 100 UNIT/ML ~~LOC~~ SOLN
0.0000 [IU] | SUBCUTANEOUS | Status: DC
  Administered 2015-01-26 (×2): 2 [IU] via SUBCUTANEOUS
  Administered 2015-01-26: 3 [IU] via SUBCUTANEOUS
  Administered 2015-01-26 – 2015-01-27 (×4): 2 [IU] via SUBCUTANEOUS
  Administered 2015-01-27: 3 [IU] via SUBCUTANEOUS
  Administered 2015-01-27 – 2015-01-31 (×20): 2 [IU] via SUBCUTANEOUS

## 2015-01-26 MED ORDER — HYDRALAZINE HCL 20 MG/ML IJ SOLN
5.0000 mg | INTRAMUSCULAR | Status: DC | PRN
  Administered 2015-01-31: 5 mg via INTRAVENOUS
  Filled 2015-01-26: qty 1

## 2015-01-26 MED ORDER — SODIUM CHLORIDE 0.9 % IJ SOLN
10.0000 mL | INTRAMUSCULAR | Status: DC | PRN
Start: 2015-01-26 — End: 2015-02-01
  Administered 2015-01-27 – 2015-01-31 (×6): 10 mL
  Filled 2015-01-26 (×6): qty 40

## 2015-01-26 MED ORDER — MORPHINE SULFATE 2 MG/ML IJ SOLN: 2.0000 mg | INTRAMUSCULAR | Status: DC | PRN

## 2015-01-26 MED ORDER — ONDANSETRON HCL 4 MG/2ML IJ SOLN: 4.0000 mg | Freq: Four times a day (QID) | INTRAMUSCULAR | Status: DC | PRN

## 2015-01-26 MED ORDER — TRACE MINERALS CR-CU-F-FE-I-MN-MO-SE-ZN IV SOLN
INTRAVENOUS | Status: AC
  Administered 2015-01-26: 17:00:00 via INTRAVENOUS
  Filled 2015-01-26: qty 960

## 2015-01-26 MED ORDER — IOHEXOL 300 MG/ML  SOLN
100.0000 mL | Freq: Once | INTRAMUSCULAR | Status: AC | PRN
  Administered 2015-01-26: 100 mL via INTRAVENOUS

## 2015-01-26 MED ORDER — SODIUM CHLORIDE 0.9 % IV BOLUS (SEPSIS)
1000.0000 mL | Freq: Once | INTRAVENOUS | Status: AC
  Administered 2015-01-26: 1000 mL via INTRAVENOUS

## 2015-01-26 NOTE — ED Notes (Signed)
Per Admitting MD. Insert NG tube and place to low wall suction. STAT

## 2015-01-26 NOTE — Progress Notes (Signed)
PT Cancellation Note  Patient Details Name: Mario Reese MRN: [id number] DOB: 23-Apr-1946   Cancelled Treatment:    Reason Eval/Treat Not Completed: Fatigue/lethargy limiting ability to participate Pt sleeping upon entering room.  Spouse present and reports she understands importance of mobility however would prefer the pt gets some rest at this time, states they both haven't sleep well recently.  Spouse agreeable for therapy to check back tomorrow.   Lynnelle Mesmer,KATHrine E 01/26/2015, 2:11 PM  Carmelia Bake, PT, DPT 01/26/2015 Pager: [phone]

## 2015-01-26 NOTE — ED Provider Notes (Signed)
CSN: [id number]     Arrival date & time 01/26/15  0225 History   First MD Initiated Contact with Patient 01/26/15 0259     Chief Complaint  Patient presents with  . Emesis  . Post-op Problem     (Consider location/radiation/quality/duration/timing/severity/associated sxs/prior Treatment) HPI Comments: Patient recently had an ileoconduit surgery done on January 29 and was discharged home 4 days ago. Prior to leaving the hospital he still had significant abdominal distention and was only tolerating clears. Since he's been home he is starting to get worsening abdominal pain and worsening nausea. Now he is unable to use anything and has had 2 episodes of vomiting since last night. He is still passing gas and had a liquid stool at about 9 PM on Sunday night. He denies fever and states the abdominal pain is intermittent and moves across his lower abdomen. He denies any drainage from his surgical sites or redness. He has been taking hydrocodone sparingly because of the way it makes him feel. However he was having the vomiting even prior to taking the hydrocodone today.  Patient is a 69 y.o. male presenting with vomiting. The history is provided by the patient and the spouse.  Emesis Severity:  Moderate Duration:  24 hours Timing:  Sporadic Number of daily episodes:  2 Quality:  Malodorous material Progression:  Worsening Chronicity:  New Associated symptoms: abdominal pain   Associated symptoms: no chills and no cough   Risk factors: prior abdominal surgery   Risk factors: no alcohol use     Past Medical History  Diagnosis Date  . Hypertension   . Hyperlipidemia   . Diabetes mellitus, type 2   . GERD (gastroesophageal reflux disease)   . Frequency of urination   . Urgency of urination   . Nocturia   . Bladder cancer   . Multiple pulmonary nodules BILATERAL -- NOT METS PER PET SCAN   Past Surgical History  Procedure Laterality Date  . Transurethral resection of bladder tumor   10/24/2012    Procedure: TRANSURETHRAL RESECTION OF BLADDER TUMOR (TURBT);  Surgeon: Alexis Frock, MD;  Location: [facility];  Service: Urology;  Laterality: N/A;  ATHY   . Cystoscopy with ureteroscopy  10/24/2012    Procedure: CYSTOSCOPY WITH URETEROSCOPY;  Surgeon: Alexis Frock, MD;  Location: [facility];  Service: Urology;  Laterality: Bilateral;  retrograde and pyelogram  . Transurethral resection of bladder tumor N/A 04/24/2013    Procedure: TRANSURETHRAL RESECTION OF BLADDER TUMOR (TURBT);  Surgeon: Alexis Frock, MD;  Location: [facility];  Service: Urology;  Laterality: N/A;  . Cystoscopy w/ retrogrades Bilateral 04/24/2013    Procedure: CYSTOSCOPY WITH  Bilateral RETROGRADE PYELOGRAM;  Surgeon: Alexis Frock, MD;  Location: [facility];  Service: Urology;  Laterality: Bilateral;  . Tonsillectomy    . Cystoscopy with biopsy N/A 12/02/2014    Procedure: CYSTOSCOPY WITH BIOPSY / [place] Free;  Surgeon: Junious Dresser, MD;  Location: [facility];  Service: Urology;  Laterality: N/A;  . Cystoscopy w/ retrogrades Bilateral 12/02/2014    Procedure: CYSTOSCOPY WITH RETROGRADE PYELOGRAM AND URETHRAL DILATION;  Surgeon: Junious Dresser, MD;  Location: [facility];  Service: Urology;  Laterality: Bilateral;  . Robot assisted laparoscopic complete cystect ileal conduit N/A 01/16/2015    Procedure: ROBOTIC ASSISTED CYSTOPROSTATECTOMY AND URETHRECTOMY  WITH CONDUIT ;  Surgeon: Alexis Frock, MD;  Location: [facility];  Service: Urology;  Laterality: N/A;  . Cystoscopy N/A 01/16/2015  Procedure: CYSTOSCOPY INDOCYANINE GREEN DYE,;  Surgeon: Alexis Frock, MD;  Location: [facility];  Service: Urology;  Laterality: N/A;  . Ileo conduit     History reviewed. No pertinent family history. History  Substance Use Topics  . Smoking status: Former Smoker -- 1.00 packs/day for 30 years    Types: Cigarettes    Quit date: 10/23/2011  . Smokeless tobacco:  Never Used     Comment: 12-01-14 now smoking again - 1ppd "nerves"  . Alcohol Use: 21.6 oz/week    36 Cans of beer per week     Comment: 6 PK BEER DAILY    Review of Systems  Constitutional: Negative for chills.  Gastrointestinal: Positive for vomiting and abdominal pain.  All other systems reviewed and are negative.     Allergies  Review of patient's allergies indicates no known allergies.  Home Medications   Prior to Admission medications   Medication Sig Start Date End Date Taking? Authorizing Provider  amLODipine (NORVASC) 10 MG tablet Take 10 mg by mouth every morning.   Yes Historical Provider, MD  glimepiride (AMARYL) 2 MG tablet Take 2 mg by mouth daily with breakfast.   Yes Historical Provider, MD  ibuprofen (ADVIL,MOTRIN) 200 MG tablet Take 400 mg by mouth daily as needed for fever, headache, moderate pain or cramping. Takes every morning   Yes Historical Provider, MD  lisinopril-hydrochlorothiazide (PRINZIDE,ZESTORETIC) 20-12.5 MG per tablet Take 2 tablets by mouth daily with breakfast.   Yes Historical Provider, MD  metformin (FORTAMET) 500 MG (OSM) 24 hr tablet Take 2,000 mg by mouth daily with breakfast.   Yes Historical Provider, MD  metoprolol (TOPROL-XL) 200 MG 24 hr tablet Take 200 mg by mouth daily with breakfast.   Yes Historical Provider, MD  omeprazole (PRILOSEC) 20 MG capsule Take 20 mg by mouth every morning.   Yes Historical Provider, MD  oxyCODONE-acetaminophen (ROXICET) 5-325 MG per tablet Take 1-2 tablets by mouth every 6 (six) hours as needed for moderate pain or severe pain. Post-operatively 01/22/15  Yes Alexis Frock, MD  pravastatin (PRAVACHOL) 40 MG tablet Take 40 mg by mouth every morning.   Yes Historical Provider, MD  aspirin EC 81 MG tablet Take 81 mg by mouth every morning.    Historical Provider, MD  Multiple Vitamin (MULTIVITAMIN WITH MINERALS) TABS tablet Take 1 tablet by mouth daily.    Historical Provider, MD  Omega-3 Fatty Acids (FISH OIL)  1000 MG CAPS Take 2 capsules by mouth every morning.    Historical Provider, MD  sennosides-docusate sodium (SENOKOT-S) 8.6-50 MG tablet Take 1 tablet by mouth 2 (two) times daily. As needed while taking pain meds to prevent constipation 01/22/15   Alexis Frock, MD  sulfamethoxazole-trimethoprim (BACTRIM) 400-80 MG per tablet Take 1 tablet by mouth 2 (two) times daily. X 3 days to prevent infection Patient not taking: Reported on 11/27/2014 04/24/13   Alexis Frock, MD  vitamin B-12 (CYANOCOBALAMIN) 1000 MCG tablet Take 1,000 mcg by mouth every morning.    Historical Provider, MD   BP 100/57 mmHg  Pulse 77  Temp(Src) 97.5 F (36.4 C) (Oral)  Resp 16  Ht 6\' 2"  (1.88 m)  Wt 258 lb (117.028 kg)  BMI 33.11 kg/m2  SpO2 91% Physical Exam  Constitutional: He is oriented to person, place, and time. He appears well-developed and well-nourished. No distress.  HENT:  Head: Normocephalic and atraumatic.  Mouth/Throat: Oropharynx is clear and moist.  Eyes: Conjunctivae and EOM are normal. Pupils are equal, round, and reactive  to light.  Neck: Normal range of motion. Neck supple.  Cardiovascular: Normal rate, regular rhythm and intact distal pulses.   No murmur heard. Pulmonary/Chest: Effort normal and breath sounds normal. No respiratory distress. He has no wheezes. He has no rales.  Abdominal: Soft. Normal appearance. He exhibits distension. Bowel sounds are decreased. There is no tenderness. There is no rebound and no guarding.    Musculoskeletal: Normal range of motion. He exhibits no edema or tenderness.  Neurological: He is alert and oriented to person, place, and time.  Skin: Skin is warm and dry. No rash noted. No erythema.  Psychiatric: He has a normal mood and affect. His behavior is normal.  Nursing note and vitals reviewed.   ED Course  Procedures (including critical care time) Labs Review Labs Reviewed  CBC WITH DIFFERENTIAL/PLATELET - Abnormal; Notable for the following:    WBC  12.2 (*)    RBC 4.21 (*)    Neutrophils Relative % 86 (*)    Lymphocytes Relative 6 (*)    Neutro Abs 10.5 (*)    All other components within normal limits  COMPREHENSIVE METABOLIC PANEL - Abnormal; Notable for the following:    Glucose, Bld 166 (*)    BUN 37 (*)    Albumin 2.8 (*)    Total Bilirubin 1.3 (*)    GFR calc non Af Amer 67 (*)    GFR calc Af Amer 78 (*)    All other components within normal limits  I-STAT CG4 LACTIC ACID, ED - Abnormal; Notable for the following:    Lactic Acid, Venous 2.05 (*)    All other components within normal limits  LIPASE, BLOOD  I-STAT TROPOININ, ED    Imaging Review Ct Abdomen Pelvis W Contrast  01/26/2015   CLINICAL DATA:  Intermittent generalized abdominal pain and abdominal distention. Mild leukocytosis. Nausea and vomiting. Recent ileal conduit surgery. Initial encounter.  EXAM: CT ABDOMEN AND PELVIS WITH CONTRAST  TECHNIQUE: Multidetector CT imaging of the abdomen and pelvis was performed using the standard protocol following bolus administration of intravenous contrast.  CONTRAST:  168mL OMNIPAQUE IOHEXOL 300 MG/ML  SOLN  COMPARISON:  CT of the abdomen and pelvis from 12/22/2014  FINDINGS: Mild focal right basilar atelectasis is noted. Scattered residual partially calcified nodules are again noted at the lung bases, stable or improved from 2013 and possibly postinfectious in nature.  There is diffuse distention of small bowel loops throughout the abdomen, measuring up to 4.4 cm in diameter. However, no focal transition point is seen. There is gradual decompression at the right mid abdomen, with mild distention of a small bowel anastomosis at the right mid abdomen. The colon is also largely filled with fluid. Findings raise concern for small bowel dysmotility and mild ileus.  The patient's ileal conduit is decompressed and not well assessed. A set of ureteral stents is noted ending at the renal pelves bilaterally, as expected. There is no evidence of  hydronephrosis. Nonspecific perinephric stranding and fluid is noted bilaterally. No renal or ureteral stones are identified. A 2.7 cm right renal cyst is noted.  Trace free fluid is noted about the liver and spleen, tracking along the paracolic gutters into the pelvis.  Minimal soft tissue air along the anterior abdominal wall likely reflects recent surgery.  There is mild prominence of the intrahepatic biliary ducts; the liver is otherwise grossly unremarkable. The common bile duct remains normal in size. The gallbladder is distended but otherwise grossly unremarkable, grossly unchanged from prior studies  dating back to 2013. The pancreas and adrenal glands are unremarkable.  The stomach is largely filled with fluid and is within normal limits. No acute vascular abnormalities are seen. Mild scattered calcification is seen along the abdominal aorta and its branches.  The appendix is not characterized. The bladder has been resected. Minimal soft tissue air is noted in tracking into the left inguinal canal, likely also postoperative in nature. No inguinal lymphadenopathy is seen.  No acute osseous abnormalities are identified. Facet disease is noted at the lower lumbar spine.  IMPRESSION: 1. Diffuse distention of small bowel loops throughout the abdomen, measuring up to 4.4 cm in diameter. No focal transition point seen. Lateral decompression of small bowel at the right mid abdomen, with mild distention of a small bowel anastomosis. The colon is largely filled with fluid. Findings are concerning for small bowel dysmotility and mild ileus. 2. Ileal conduit decompressed and not well assessed. Bilateral ureteral stents are grossly unremarkable in appearance. No evidence of hydronephrosis. 3. Gallbladder distended, as on prior studies, but otherwise grossly unremarkable. Mild prominence of the intrahepatic biliary ducts again noted. The common bile duct remains normal in caliber. 4. Trace free fluid noted within the  abdomen and pelvis. 5. Minimal soft tissue air along the anterior abdominal wall and tracking into the left inguinal canal is likely postoperative in nature. 6. Mild focal right basilar atelectasis noted. Scattered residual partially calcified pulmonary nodules are stable or improved from 2013, likely postinfectious in nature. 7. Small right renal cyst seen. 8. Mild scattered calcification along the abdominal aorta and its branches.   Electronically Signed   By: Garald Balding M.D.   On: 01/26/2015 06:23     EKG Interpretation   Date/Time:  Monday January 26 2015 05:04:08 EST Ventricular Rate:  65 PR Interval:  161 QRS Duration: 101 QT Interval:  439 QTC Calculation: 456 R Axis:   -2 Text Interpretation:  Sinus rhythm Low voltage, precordial leads Abnormal  R-wave progression, early transition No significant change since last  tracing Confirmed by Maryan Rued  MD, Loree Fee ([id number]) on 01/26/2015 5:13:04 AM      MDM   Final diagnoses:  Abdominal pain  Ileus    Patient with a history of recent ileoconduit placed after removal of his prostate and bladder for cancer. Patient was sent home approximately 4 days ago and since being home has had worsening abdominal pain, several episodes in the last 24 hours of nausea and vomiting and is unable to eat. Patient is occasionally passing gas and did have a bowel movement of liquid stool at about 9 PM. He is taking the hydrocodone sparingly because of how thinks him feel. He denies any fevers. On exam patient is distended with markedly decreased bowel sounds. Vital signs are stable. Concern for ileus versus surgical complication. At this time low suspicion for infection or abscess.  CBC with mild leukocytosis of 12,000 and lactate slightly elevated at 2.05. CMP, lipase, troponin, CT of the abdomen and pelvis pending for further evaluation. Patient given IV fluids and nausea control. Currently he states he's not having much abdominal pain  6:29 AM Ct  showed ileus.  Will discuss with urology.  Blanchie Dessert, MD 01/26/15 [phone]

## 2015-01-26 NOTE — Progress Notes (Signed)
INITIAL NUTRITION ASSESSMENT  DOCUMENTATION CODES Per approved criteria  -Obesity Unspecified   INTERVENTION: Initiate TPN per pharmacy  Provide 2200 kcal, 130 grams protein  RD will continue to monitor  NUTRITION DIAGNOSIS: Inadequate oral intake related to inability to eat as evidenced by NPO status.   Goal: Pt to meet >/= 90% estimated energy requirements  Monitor:  TPN, weight, labs, I/O's  Reason for Assessment: Consult for new TNA/TPN  69 y.o. 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 He was discharged 4 days ago. Prior to leaving the hospital, he still had significant abdominal distention and was only tolerating clear liquids. Once at home, he started experiencing worsening abdominal pain and worsening nausea. Unable to consume anything, and has had 2 episodes of vomiting since last night.   Denies fever and states the abdominal pain is intermittent. History of bladder cancer, multiple pulmonary nodes, GERD, DM2. 2/8- received NG tube with suction-low intermittent  2/8 CT scan- diffuse ileus  Labs- elevated glucose, BUN  Pt's significant other in room; discussed wt hx and appetite pta. She reported little intake; no significant wt loss. Pt was sleeping at time of DI assessment.   Initiate TPN per pharmacy, providing 2100-2300 kcal, 120-138 grams protein  Nutrition Focused Physical Exam:  Subcutaneous Fat:  Orbital Region: WDL Upper Arm Region: WDL Thoracic and Lumbar Region: WDL  Muscle:  Temple Region: WDL Clavicle Bone Region: WDL Clavicle and Acromion Bone Region: WDL Scapular Bone Region: n/a Dorsal Hand: WDL Patellar Region: WDL Anterior Thigh Region: WDL Posterior Calf Region: WDL  Edema: not present  Height: Ht Readings from Last 1 Encounters:  01/26/15 6\' 2"  (1.88 m)    Weight: Wt Readings from Last 1 Encounters:   01/26/15 258 lb (117.028 kg)    Ideal Body Weight: 190 lb (86.4 kg)  % Ideal Body Weight: 135.7%  Wt Readings from Last 10 Encounters:  01/26/15 258 lb (117.028 kg)  01/19/15 256 lb 13.4 oz (116.5 kg)  12/02/14 255 lb (115.667 kg)  04/24/13 287 lb (130.182 kg)  10/24/12 295 lb 6.7 oz (134 kg)    Usual Body Weight: unknown  % Usual Body Weight: -  BMI:  Body mass index is 33.11 kg/(m^2). obesity class I  Estimated Nutritional Needs: Kcal: 2100-2300  Protein: 120-138 grams Fluid: per MD  Skin: abdominal distention  Diet Order: TPN (CLINIMIX-E) Adult  EDUCATION NEEDS: -No education needs identified at this time   Intake/Output Summary (Last 24 hours) at 01/26/15 1304 Last data filed at 01/26/15 1144  Gross per 24 hour  Intake   1050 ml  Output   1875 ml  Net   -825 ml    Last BM: 2/7 loose  Labs:   Recent Labs Lab 01/26/15 0358 01/26/15 1030  NA 140 136  K 3.8 3.8  CL 99 96  CO2 27 30  BUN 37* 43*  CREATININE 1.09 1.25  CALCIUM 9.3 8.6  GLUCOSE 166* 146*    CBG (last 3)  No results for input(s): GLUCAP in the last 72 hours.  Scheduled Meds: . heparin subcutaneous  5,000 Units Subcutaneous 3 times per day  . insulin aspart  0-15 Units Subcutaneous 6 times per day  . pantoprazole (PROTONIX) IV  40 mg Intravenous Q24H    Continuous Infusions: . Marland KitchenTPN (CLINIMIX-E) Adult     And  . fat emulsion    .  lactated ringers 125 mL/hr at 01/26/15 1045  . lactated ringers      Past Medical History  Diagnosis Date  . Hypertension   . Hyperlipidemia   . Diabetes mellitus, type 2   . GERD (gastroesophageal reflux disease)   . Frequency of urination   . Urgency of urination   . Nocturia   . Bladder cancer   . Multiple pulmonary nodules BILATERAL -- NOT METS PER PET SCAN    Past Surgical History  Procedure Laterality Date  . Transurethral resection of bladder tumor  10/24/2012    Procedure: TRANSURETHRAL RESECTION OF BLADDER TUMOR (TURBT);   Surgeon: Alexis Frock, MD;  Location: [facility];  Service: Urology;  Laterality: N/A;  ATHY   . Cystoscopy with ureteroscopy  10/24/2012    Procedure: CYSTOSCOPY WITH URETEROSCOPY;  Surgeon: Alexis Frock, MD;  Location: [facility];  Service: Urology;  Laterality: Bilateral;  retrograde and pyelogram  . Transurethral resection of bladder tumor N/A 04/24/2013    Procedure: TRANSURETHRAL RESECTION OF BLADDER TUMOR (TURBT);  Surgeon: Alexis Frock, MD;  Location: [facility];  Service: Urology;  Laterality: N/A;  . Cystoscopy w/ retrogrades Bilateral 04/24/2013    Procedure: CYSTOSCOPY WITH  Bilateral RETROGRADE PYELOGRAM;  Surgeon: Alexis Frock, MD;  Location: [facility];  Service: Urology;  Laterality: Bilateral;  . Tonsillectomy    . Cystoscopy with biopsy N/A 12/02/2014    Procedure: CYSTOSCOPY WITH BIOPSY / Almyra Free;  Surgeon: Junious Dresser, MD;  Location: [facility];  Service: Urology;  Laterality: N/A;  . Cystoscopy w/ retrogrades Bilateral 12/02/2014    Procedure: CYSTOSCOPY WITH RETROGRADE PYELOGRAM AND URETHRAL DILATION;  Surgeon: Junious Dresser, MD;  Location: [facility];  Service: Urology;  Laterality: Bilateral;  . Robot assisted laparoscopic complete cystect ileal conduit N/A 01/16/2015    Procedure: ROBOTIC ASSISTED CYSTOPROSTATECTOMY AND URETHRECTOMY  WITH CONDUIT ;  Surgeon: Alexis Frock, MD;  Location: [facility];  Service: Urology;  Laterality: N/A;  . Cystoscopy N/A 01/16/2015    Procedure: CYSTOSCOPY INDOCYANINE GREEN DYE,;  Surgeon: Alexis Frock, MD;  Location: [facility];  Service: Urology;  Laterality: N/A;  . Ileo conduit      Wynona Dove, MS Dietetic Intern Pager: [phone]

## 2015-01-26 NOTE — ED Notes (Signed)
Nurse will draw labs. 

## 2015-01-26 NOTE — ED Notes (Signed)
Patient transported to CT 

## 2015-01-26 NOTE — Consult Note (Signed)
WOC ostomy consult note: Patient known to me from previous admisison Stoma type/location: RUQ ileal conduit with two stents intact.  Red = right ureter, Blue = left ureter Stomal assessment/size: 1inch x 1 and 1/2 inch oval, slightly budded, red, moist Peristomal assessment: Intact in the peristomal plane.  Two areas of resolving tension blisters outside of the 4-inch zone: one medial and one lateral Treatment options for stomal/peristomal skin: Resolving tension blisters are covered with hydrocolloid dressings prior to pouching. One-half of a skin barrier ring is placed from 3-9 o'clock prior to pouching Output clear yellow urine Ostomy pouching: 1pc.convex urostomy pouch with skin barrier ring Education provided: Patient and wife complimented on their first attempt at pouch change on Saturday.  Stoma is in the center of the aperture and an excellent seal is evident. Both express anxiety and exhaustion over the recent events of nausea, vomiting, and abdominal pain which led to readmission.  Supplies ordered top bedside and [place] nurse provided pouch change and reassurance. [place] nursing team will follow, andwill remain available to this patient, the nursing and medical teams.  Thanks, Maudie Flakes, MSN, RN, [place], [place], [place] [phone])

## 2015-01-26 NOTE — Progress Notes (Signed)
PARENTERAL NUTRITION CONSULT NOTE - INITIAL  Pharmacy Consult for TPN Indication: Prolonged Ileus  No Known Allergies  Patient Measurements: Height: 6\' 2"  (188 cm) Weight: 258 lb (117.028 kg) IBW/kg (Calculated) : 82.2  Vital Signs: Temp: 97.8 F (36.6 C) (02/08 0946) Temp Source: Oral (02/08 0946) BP: 130/57 mmHg (02/08 0946) Pulse Rate: 78 (02/08 0946) Intake/Output from previous day:   Intake/Output from this shift: Total I/O In: 1050 [IV Piggyback:1050] Out: 8309 [Urine:175; Emesis/NG output:300; Other:1200]  Labs:  Recent Labs  01/26/15 0358  WBC 12.2*  HGB 14.1  HCT 41.7  PLT 316     Recent Labs  01/26/15 0358  NA 140  K 3.8  CL 99  CO2 27  GLUCOSE 166*  BUN 37*  CREATININE 1.09  CALCIUM 9.3  PROT 6.4  ALBUMIN 2.8*  AST 24  ALT 15  ALKPHOS 58  BILITOT 1.3*   Estimated Creatinine Clearance: 86.9 mL/min (by C-G formula based on Cr of 1.09).   No results for input(s): GLUCAP in the last 72 hours.  Medical History: Past Medical History  Diagnosis Date  . Hypertension   . Hyperlipidemia   . Diabetes mellitus, type 2   . GERD (gastroesophageal reflux disease)   . Frequency of urination   . Urgency of urination   . Nocturia   . Bladder cancer   . Multiple pulmonary nodules BILATERAL -- NOT METS PER PET SCAN    Medications:  Scheduled:  . heparin subcutaneous  5,000 Units Subcutaneous 3 times per day  . insulin aspart  0-15 Units Subcutaneous 6 times per day  . pantoprazole (PROTONIX) IV  40 mg Intravenous Q24H   Infusions:  . lactated ringers        Insulin Requirements:   Current Nutrition: NPO  IVF: LR @ 121ml/hr  Central access: PICC to be placed 2/8 TPN start date: 2/8  ASSESSMENT                                                                                                          HPI:  66M s/p cystoprostectomy with ileal conduit recently discharged (2/4) who presents to the ED with nausea/vomitting/failure to thrive  at home.  CT scan shows diffuse ileus.  Pharmacy consulted to start TPN.  Significant events:   Today:    Glucose - 166  Electrolytes - all WNL  Renal - Scr 1.1, CrCl ~ 9mls/min  LFTs - all WNL  TGs - will obtain with am labs  Prealbumin - will obtain with am labs  NUTRITIONAL GOALS  RD recs: consulted/ will f/u on recs Clinimix / at a goal rate of ml/hr + 20% fat emulsion at ml/hr to provide: g/day protein, Kcal/day.  PLAN                                                                                                                         At 1800 today:  Start Clinimix E 5/15 at 63ml/hr.  20% fat emulsion at 12ml/hr.  Plan to advance as tolerated to the goal rate.  TPN to contain standard multivitamins and trace elements.  Reduce IVF to 85 ml/hr.  Continue moderate SSI .   TPN labs, prealbumin, triglycerides, mag and phos with am labs  TPN lab panels on Mondays & Thursdays.  F/u daily.   Dolly Rias RPh 01/26/2015, 10:40 AM Pager [phone]

## 2015-01-26 NOTE — ED Notes (Signed)
Bed: WA11 Expected date:  Expected time:  Means of arrival:  Comments: 

## 2015-01-26 NOTE — ED Notes (Signed)
Patient O2 sat 88 patient placed on 2L [place].

## 2015-01-26 NOTE — Progress Notes (Signed)
Peripherally Inserted Central Catheter/Midline Placement  The IV Nurse has discussed with the patient and/or persons authorized to consent for the patient, the purpose of this procedure and the potential benefits and risks involved with this procedure.  The benefits include less needle sticks, lab draws from the catheter and patient may be discharged home with the catheter.  Risks include, but not limited to, infection, bleeding, blood clot (thrombus formation), and puncture of an artery; nerve damage and irregular heat beat.  Alternatives to this procedure were also discussed.  PICC/Midline Placement Documentation        Henderson [place] 01/26/2015, 3:25 PM

## 2015-01-26 NOTE — Care Management Note (Addendum)
    Page 1 of 1   01/29/2015     2:55:00 PM CARE MANAGEMENT NOTE 01/29/2015  Patient:  Mario Reese, Mario Reese   Account Number:  [id number]  Date Initiated:  01/26/2015  Documentation initiated by:  Dessa Phi  Subjective/Objective Assessment:   69 y/o m admitted w/Ileus.Readmit-1/28-01/22/15-Bladder Ca, cystoprostatectomy-ileal conduit,ileostomy.     Action/Plan:   From home.   Anticipated DC Date:  01/30/2015   Anticipated DC Plan:  [place]  CM consult      Choice offered to / List presented to:             Status of service:  In process, will continue to follow Medicare Important Message given?  YES (If response is "NO", the following Medicare IM given date fields will be blank) Date Medicare IM given:  01/29/2015 Medicare IM given by:  [facility] Date Additional Medicare IM given:   Additional Medicare IM given by:    Discharge Disposition:    Per UR Regulation:  Reviewed for med. necessity/level of care/duration of stay  If discussed at [place] of Stay Meetings, dates discussed:    Comments:  01/29/15 Dessa Phi RN BSN NCM [phone] Await PT recommendations.[facility] following if HHC needed & ordered.  01/27/15 Dessa Phi RN BSN NCM [phone] [facility] was ordered from prior admission but did not complete home visit before readmission.Patient agree to allow [facility] for [facility] if recommended.TC [facility] Kristen rep aware of referral.Await PT recommendations, & orders.  01/26/15 Dessa Phi RN BSN NCM [phone] NGT, TPN, IVF, Wound care.Monitor progress for d/c needs.PT cons will await recommendations.

## 2015-01-26 NOTE — H&P (Signed)
ID: 46M s/p cystoprostectomy with ileal conduit recently discharged who presents to the ED with nausea/vomitting.  HPI:  S/p robotic cystoprostatectomy with bilateral sentinal + template lymphadenectomy and total urethectomy 01/16/15 for pT4N0Mx urthelial carcinoma of bladder-urethra.  Hospital course unremarkable.  Was discharged on 01/22/15 home.  Over the last few days he has had progressive nausea.  Had some bilious vomitting yesterday.  Last BM was last night.  Not able to eat much.  No fevers/chills.  Pain reasonable well controlled.  Ambulating without balance issues.  ROS: Negative unless as above.    Past Medical History  Diagnosis Date  . Hypertension   . Hyperlipidemia   . Diabetes mellitus, type 2   . GERD (gastroesophageal reflux disease)   . Frequency of urination   . Urgency of urination   . Nocturia   . Bladder cancer   . Multiple pulmonary nodules BILATERAL -- NOT METS PER PET SCAN   Past Surgical History  Procedure Laterality Date  . Transurethral resection of bladder tumor  10/24/2012    Procedure: TRANSURETHRAL RESECTION OF BLADDER TUMOR (TURBT);  Surgeon: Alexis Frock, MD;  Location: [facility];  Service: Urology;  Laterality: N/A;  ATHY   . Cystoscopy with ureteroscopy  10/24/2012    Procedure: CYSTOSCOPY WITH URETEROSCOPY;  Surgeon: Alexis Frock, MD;  Location: [facility];  Service: Urology;  Laterality: Bilateral;  retrograde and pyelogram  . Transurethral resection of bladder tumor N/A 04/24/2013    Procedure: TRANSURETHRAL RESECTION OF BLADDER TUMOR (TURBT);  Surgeon: Alexis Frock, MD;  Location[facility];  Service: Urology;  Laterality: N/A;  . Cystoscopy w/ retrogrades Bilateral 04/24/2013    Procedure: CYSTOSCOPY WITH  Bilateral RETROGRADE PYELOGRAM;  Surgeon: Alexis Frock, MD;  Location: [facility];  Service: Urology;  Laterality: Bilateral;  . Tonsillectomy    . Cystoscopy with biopsy N/A  12/02/2014    Procedure: CYSTOSCOPY WITH BIOPSY / Almyra Free;  Surgeon: Junious Dresser, MD;  [facility];  Service: Urology;  Laterality: N/A;  . Cystoscopy w/ retrogrades Bilateral 12/02/2014    Procedure: CYSTOSCOPY WITH RETROGRADE PYELOGRAM AND URETHRAL DILATION;  Surgeon: Junious Dresser, MD;  Location: [facility];  Service: Urology;  Laterality: Bilateral;  . Robot assisted laparoscopic complete cystect ileal conduit N/A 01/16/2015    Procedure: ROBOTIC ASSISTED CYSTOPROSTATECTOMY AND URETHRECTOMY  WITH CONDUIT ;  Surgeon: Alexis Frock, MD;  Location: [facility];  Service: Urology;  Laterality: N/A;  . Cystoscopy N/A 01/16/2015    Procedure: CYSTOSCOPY INDOCYANINE GREEN DYE,;  Surgeon: Alexis Frock, MD;  Location: [facility];  Service: Urology;  Laterality: N/A;  . Ileo conduit     PE: NAD, alert and oriented Filed Vitals:   01/26/15 0240 01/26/15 0505 01/26/15 0507 01/26/15 0730  BP: 100/57 94/67 94/67  113/67  Pulse: 77 68 62 64  Temp: 97.5 F (36.4 C)     TempSrc: Oral     Resp: 16 22 14 16   Height: 6\' 2"  (1.88 m)     Weight: 117.028 kg (258 lb)     SpO2: 91% 96% 98% 96%  Abdomen diffusely distended, tympanitic.  Appropriately tender.   Incision is c/d/i, JP drain site closed.  Stoma pink, mucous in bag, stents emanating from ostomy. Extremities symmetric without edema/calf tenderness  Recent Labs  01/26/15 0358  WBC 12.2*  HGB 14.1  HCT 41.7    Recent Labs  01/26/15 0358  NA 140  K 3.8  CL 99  CO2 27  GLUCOSE 166*  BUN 37*  CREATININE 1.09  CALCIUM 9.3   No results for input(s): LABPT, INR in the last 72 hours. No results for input(s): LABURIN in the last 72 hours. Results for orders placed or performed during the hospital encounter of 01/15/15  Surgical pcr screen     Status: None   Collection Time: 01/16/15  5:44 AM  Result Value Ref Range Status   MRSA, PCR NEGATIVE NEGATIVE Final   Staphylococcus aureus NEGATIVE NEGATIVE Final    Comment:         The Xpert SA Assay (FDA approved for NASAL specimens in patients over 6 years of age), is one component of a comprehensive surveillance program.  Test performance has been validated by [facility] for patients greater than or equal to 22 year old. It is not intended to diagnose infection nor to guide or monitor treatment.    CT scan shows diffuse ileus.  Imp:  S/P robotic cystoprostatectomy with bilateral sentinal + template lymphadenectomy and total urethectomy 01/16/15 for pT4N0Mx urthelial carcinoma of bladder-urethra with failure to thrive at home, nausea/vomitting and diffuse ileus on Ct scan. Plan:  NGT - LCWS PICC > TPN Wound nurse consult PT/OT SSI SQH/ PPI Hydralazine PRN for SBP >150 IV tylenol for pain.

## 2015-01-26 NOTE — ED Notes (Addendum)
Per pt and family pt has been experiencing n/v that began yesterday - pt had ileo conduit surgery on 01/16/15 - Dr. Kenton Kingfisher w/ urology advised pt to be evaluated in ED. Dr. Tresa Moore performed pt's surgery. Pt admits that since he was discharged he has been experiencing abd distention and intermittent abd pain. Denies recent fever.

## 2015-01-27 ENCOUNTER — Inpatient Hospital Stay (HOSPITAL_COMMUNITY): Payer: Medicare Other

## 2015-01-27 LAB — CBC
HCT: 32.6 % — ABNORMAL LOW (ref 39.0–52.0)
Hemoglobin: 10.8 g/dL — ABNORMAL LOW (ref 13.0–17.0)
MCH: 32.9 pg (ref 26.0–34.0)
MCHC: 33.1 g/dL (ref 30.0–36.0)
MCV: 99.4 fL (ref 78.0–100.0)
Platelets: 237 10*3/uL (ref 150–400)
RBC: 3.28 MIL/uL — ABNORMAL LOW (ref 4.22–5.81)
RDW: 12.4 % (ref 11.5–15.5)
WBC: 7.6 10*3/uL (ref 4.0–10.5)

## 2015-01-27 LAB — COMPREHENSIVE METABOLIC PANEL
ALK PHOS: 108 U/L (ref 39–117)
ALT: 12 U/L (ref 0–53)
AST: 16 U/L (ref 0–37)
Albumin: 2.1 g/dL — ABNORMAL LOW (ref 3.5–5.2)
Anion gap: 8 (ref 5–15)
BUN: 37 mg/dL — ABNORMAL HIGH (ref 6–23)
CHLORIDE: 101 mmol/L (ref 96–112)
CO2: 33 mmol/L — AB (ref 19–32)
Calcium: 8.3 mg/dL — ABNORMAL LOW (ref 8.4–10.5)
Creatinine, Ser: 0.84 mg/dL (ref 0.50–1.35)
GFR, EST NON AFRICAN AMERICAN: 87 mL/min — AB (ref >90)
GLUCOSE: 152 mg/dL — AB (ref 70–99)
POTASSIUM: 3.3 mmol/L — AB (ref 3.5–5.1)
SODIUM: 142 mmol/L (ref 135–145)
Total Bilirubin: 0.6 mg/dL (ref 0.3–1.2)
Total Protein: 5.2 g/dL — ABNORMAL LOW (ref 6.0–8.3)

## 2015-01-27 LAB — DIFFERENTIAL
BASOS ABS: 0 10*3/uL (ref 0.0–0.1)
BASOS PCT: 0 % (ref 0–1)
EOS ABS: 0.2 10*3/uL (ref 0.0–0.7)
Eosinophils Relative: 2 % (ref 0–5)
LYMPHS ABS: 0.7 10*3/uL (ref 0.7–4.0)
Lymphocytes Relative: 10 % — ABNORMAL LOW (ref 12–46)
Monocytes Absolute: 0.8 10*3/uL (ref 0.1–1.0)
Monocytes Relative: 10 % (ref 3–12)
NEUTROS ABS: 5.9 10*3/uL (ref 1.7–7.7)
NEUTROS PCT: 78 % — AB (ref 43–77)

## 2015-01-27 LAB — GLUCOSE, CAPILLARY
GLUCOSE-CAPILLARY: 149 mg/dL — AB (ref 70–99)
GLUCOSE-CAPILLARY: 148 mg/dL — AB (ref 70–99)
GLUCOSE-CAPILLARY: 135 mg/dL — AB (ref 70–99)
GLUCOSE-CAPILLARY: 129 mg/dL — AB (ref 70–99)
GLUCOSE-CAPILLARY: 128 mg/dL — AB (ref 70–99)
Glucose-Capillary: 138 mg/dL — ABNORMAL HIGH (ref 70–99)
Glucose-Capillary: 148 mg/dL — ABNORMAL HIGH (ref 70–99)
Glucose-Capillary: 131 mg/dL — ABNORMAL HIGH (ref 70–99)
Glucose-Capillary: 125 mg/dL — ABNORMAL HIGH (ref 70–99)

## 2015-01-27 LAB — TRIGLYCERIDES: Triglycerides: 331 mg/dL — ABNORMAL HIGH (ref <150)

## 2015-01-27 LAB — PREALBUMIN: Prealbumin: 8.9 mg/dL — ABNORMAL LOW (ref 17.0–34.0)

## 2015-01-27 LAB — MAGNESIUM: Magnesium: 1.9 mg/dL (ref 1.5–2.5)

## 2015-01-27 LAB — PHOSPHORUS: Phosphorus: 3.5 mg/dL (ref 2.3–4.6)

## 2015-01-27 MED ORDER — POTASSIUM CHLORIDE 10 MEQ/50ML IV SOLN
10.0000 meq | INTRAVENOUS | Status: AC
  Administered 2015-01-27 (×4): 10 meq via INTRAVENOUS
  Filled 2015-01-27 (×8): qty 50

## 2015-01-27 MED ORDER — TRACE MINERALS CR-CU-F-FE-I-MN-MO-SE-ZN IV SOLN
INTRAVENOUS | Status: AC
  Administered 2015-01-27: 18:00:00 via INTRAVENOUS
  Filled 2015-01-27: qty 960

## 2015-01-27 MED ORDER — FAT EMULSION 20 % IV EMUL
240.0000 mL | INTRAVENOUS | Status: AC
  Administered 2015-01-27: 240 mL via INTRAVENOUS
  Filled 2015-01-27: qty 250

## 2015-01-27 NOTE — Progress Notes (Signed)
Patient having frequent flatus and produced large loose BM.  Will continue to monitor.

## 2015-01-27 NOTE — Plan of Care (Signed)
Problem: Phase I Progression Outcomes Goal: Voiding-avoid urinary catheter unless indicated Outcome: Not Applicable Date Met:  70/22/02 Ileal conduit

## 2015-01-27 NOTE — Progress Notes (Signed)
Spoke with Infection control regarding patient's loose BM to see if he needed to be placed on contact precautions to r/o c.diff since he has had >3 loose BM in a 24 hour period. Patient is admitted with a SBO/ileus and currently has an NG to LIWS.  Patient has not had a BM since before surgery ~10 days ago.  Patient is starting to produce substantial BM as ileus is resolving.  Per Infection control, patient does not need to be placed on c.diff precautions due to circumstances surrounding loose BMs.

## 2015-01-27 NOTE — Progress Notes (Signed)
PARENTERAL NUTRITION CONSULT NOTE - follow up  Pharmacy Consult for TPN Indication: Prolonged Ileus  No Known Allergies  Patient Measurements: Height: 6\' 2"  (188 cm) Weight: 258 lb (117.028 kg) IBW/kg (Calculated) : 82.2  Vital Signs: Temp: 97.9 F (36.6 C) (02/09 0435) Temp Source: Oral (02/09 0435) BP: 142/58 mmHg (02/09 0435) Pulse Rate: 86 (02/09 0435) Intake/Output from previous day: 02/08 0701 - 02/09 0700 In: 3381.9 [I.V.:1893.8; IV Piggyback:1050; TPN:438.2] Out: 0109 [Urine:475; Emesis/NG output:2150; Stool:300] Intake/Output from this shift: Total I/O In: -  Out: 350 [Urine:350]  Labs:  Recent Labs  01/26/15 0358 01/26/15 1030 01/27/15 0420  WBC 12.2* 9.2 7.6  HGB 14.1 13.0 10.8*  HCT 41.7 38.6* 32.6*  PLT 316 283 237     Recent Labs  01/26/15 0358 01/26/15 1030 01/27/15 0420  NA 140 136 142  K 3.8 3.8 3.3*  CL 99 96 101  CO2 27 30 33*  GLUCOSE 166* 146* 152*  BUN 37* 43* 37*  CREATININE 1.09 1.25 0.84  CALCIUM 9.3 8.6 8.3*  MG  --   --  1.9  PHOS  --   --  3.5  PROT 6.4  --  5.2*  ALBUMIN 2.8*  --  2.1*  AST 24  --  16  ALT 15  --  12  ALKPHOS 58  --  108  BILITOT 1.3*  --  0.6  TRIG  --   --  331*   Estimated Creatinine Clearance: 112.8 mL/min (by C-G formula based on Cr of 0.84).   No results for input(s): GLUCAP in the last 72 hours.  Medical History: Past Medical History  Diagnosis Date  . Hypertension   . Hyperlipidemia   . Diabetes mellitus, type 2   . GERD (gastroesophageal reflux disease)   . Frequency of urination   . Urgency of urination   . Nocturia   . Bladder cancer   . Multiple pulmonary nodules BILATERAL -- NOT METS PER PET SCAN    Medications:  Scheduled:  . heparin subcutaneous  5,000 Units Subcutaneous 3 times per day  . insulin aspart  0-15 Units Subcutaneous 6 times per day  . pantoprazole (PROTONIX) IV  40 mg Intravenous Q24H  . potassium chloride  10 mEq Intravenous Q1 Hr x 4   Infusions:  .  Marland KitchenTPN (CLINIMIX-E) Adult 40 mL/hr at 01/26/15 1714   And  . fat emulsion 240 mL (01/26/15 1715)  . lactated ringers 85 mL/hr at 01/26/15 1830      Insulin Requirements: 11 Units yesterday  Current Nutrition: TPN  IVF: LR @ 8ml/hr  Central access: PICC placed 2/8 TPN start date: 2/8  ASSESSMENT                                                                                                          HPI:  89M s/p cystoprostectomy with ileal conduit recently discharged (2/4) who presents to the ED with nausea/vomitting/failure to thrive at home.  CT scan shows diffuse ileus.  Pharmacy consulted to start TPN.  Significant events:  Today:    Glucose - 152  Electrolytes - K 3.3, CCa 9.8 all other WNL  Renal - Scr 0.84, CrCl >166mls/min  LFTs - all WNL  TGs - 331   Prealbumin-in process  NUTRITIONAL GOALS                                                                                             RD recs:  Clinimix E 5/15 at a goal rate of 100 ml/hr + 20% fat emulsion at 30ml/hr to provide:120g/day protein, 2184Kcal/day.  PLAN                                                                                                                         At 1800 today:  Continue Clinimix E 5/15 at 73ml/hr, will watch lytes and advance when stabilize.  20% fat emulsion at 5ml/hr, follow triglycerides  Plan to advance as tolerated to the goal rate.  TPN to contain standard multivitamins and trace elements.  Continue IVF at 85 ml/hr.  KCl 62meq IV x 4  Continue moderate SSI .   BMet, Mag and Phos with am labs  TPN lab panels on Mondays & Thursdays.  F/u daily.   Dolly Rias RPh 01/27/2015, 11:35 AM Pager [phone]

## 2015-01-27 NOTE — Progress Notes (Signed)
Subjective:  1 - Post-op Ileus - s/p ileal conduit urinary diversion as part of cystectomy 01/16/2015. Discharged 01/22/15 as toll reg diet with resumed bowel function. Readmitted 01/26/2015 with incrasing abd distension, CT evidence of likely ileus (no obvious obstruciton / transition point).PICC, NG, TPN started 2/8 for trial of bowel rest.   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   3 - Disposition / Rehab - . Pt [facility] independantly [facility]. Has walker, riser for Dc home when ready. Ostomy RN and PT following in house.  Today Mario Reese is feeling improved continued flatus and some BM, much less abd distention. NGT output trending down after initial placement. NO fevers.   Objective: Vital signs in last 24 hours: Temp:  [97.8 F (36.6 C)-98.6 F (37 C)] 97.9 F (36.6 C) (02/09 0435) Pulse Rate:  [66-86] 86 (02/09 0435) Resp:  [16] 16 (02/09 0435) BP: (113-142)/(55-67) 142/58 mmHg (02/09 0435) SpO2:  [78 %-97 %] 93 % (02/09 0435)    Intake/Output from previous day: 02/08 0701 - 02/09 0700 In: 3381.9 [I.V.:1893.8; IV Piggyback:1050; TPN:438.2] Out: 4125 [Urine:475; Emesis/NG output:2150; Stool:300] Intake/Output this shift: Total I/O In: 1425.7 [I.V.:987.5; TPN:438.2] Out: 1200 [Urine:300; Emesis/NG output:900]  General appearance: alert, cooperative, appears stated age and family at bedside Eyes: negative Nose: Nares normal. Septum midline. Mucosa normal. No drainage or sinus tenderness., NGT in place with NON-bilious output.  Throat: lips, mucosa, and tongue normal; teeth and gums normal Neck: supple, symmetrical, trachea midline Back: symmetric, no curvature. ROM normal. No CVA tenderness. Resp: non-labored on room air Cardio: Nl rate GI: decreased distention. RLQ URostomy pink / patent with Bander  stents in place.  Male genitalia: normal Extremities: extremities normal, atraumatic, no cyanosis or edema Pulses: 2+ and symmetric Skin: Skin color, texture, turgor normal. No rashes or lesions Lymph nodes: Cervical, supraclavicular, and axillary nodes normal. Neurologic: Grossly normal Incision/Wound: recent abdominal and perineal incision sites c/d/i.   Lab Results:   Recent Labs  01/26/15 1030 01/27/15 0420  WBC 9.2 7.6  HGB 13.0 10.8*  HCT 38.6* 32.6*  PLT 283 237   BMET  Recent Labs  01/26/15 1030 01/27/15 0420  NA 136 142  K 3.8 3.3*  CL 96 101  CO2 30 33*  GLUCOSE 146* 152*  BUN 43* 37*  CREATININE 1.25 0.84  CALCIUM 8.6 8.3*   PT/INR No results for input(s): LABPROT, INR in the last 72 hours. ABG No results for input(s): PHART, HCO3 in the last 72 hours.  Invalid input(s): PCO2, PO2  Studies/Results: Dg Abd 1 View  01/26/2015   CLINICAL DATA:  Ileus. Nasogastric tube placement. Initial encounter.  EXAM: ABDOMEN - 1 VIEW  COMPARISON:  CT 01/26/2015.  FINDINGS: Interval nasogastric tube placement, tip in the right upper quadrant of the abdomen consistent with positioned in the distal stomach. There is persistent diffuse small and large bowel distension. No evidence of pneumatosis or free intraperitoneal air. Bilateral ureteral stents appear unchanged.  IMPRESSION: Nasogastric tube position in the distal stomach. No significant change in diffuse ileus.   Electronically Signed   By: Camie Patience M.D.   On: 01/26/2015 08:27   Ct Abdomen Pelvis W Contrast  01/26/2015   CLINICAL DATA:  Intermittent generalized abdominal pain and abdominal distention. Mild leukocytosis. Nausea and vomiting. Recent ileal conduit surgery. Initial encounter.  EXAM: CT ABDOMEN AND PELVIS WITH CONTRAST  TECHNIQUE: Multidetector CT imaging of the abdomen and pelvis was performed using the standard protocol following bolus administration of intravenous contrast.  CONTRAST:  166mL OMNIPAQUE  IOHEXOL 300 MG/ML  SOLN  COMPARISON:  CT of the abdomen and pelvis from 12/22/2014  FINDINGS: Mild focal right basilar atelectasis is noted. Scattered residual partially calcified nodules are again noted at the lung bases, stable or improved from 2013 and possibly postinfectious in nature.  There is diffuse distention of small bowel loops throughout the abdomen, measuring up to 4.4 cm in diameter. However, no focal transition point is seen. There is gradual decompression at the right mid abdomen, with mild distention of a small bowel anastomosis at the right mid abdomen. The colon is also largely filled with fluid. Findings raise concern for small bowel dysmotility and mild ileus.  The patient's ileal conduit is decompressed and not well assessed. A set of ureteral stents is noted ending at the renal pelves bilaterally, as expected. There is no evidence of hydronephrosis. Nonspecific perinephric stranding and fluid is noted bilaterally. No renal or ureteral stones are identified. A 2.7 cm right renal cyst is noted.  Trace free fluid is noted about the liver and spleen, tracking along the paracolic gutters into the pelvis.  Minimal soft tissue air along the anterior abdominal wall likely reflects recent surgery.  There is mild prominence of the intrahepatic biliary ducts; the liver is otherwise grossly unremarkable. The common bile duct remains normal in size. The gallbladder is distended but otherwise grossly unremarkable, grossly unchanged from prior studies dating back to 2013. The pancreas and adrenal glands are unremarkable.  The stomach is largely filled with fluid and is within normal limits. No acute vascular abnormalities are seen. Mild scattered calcification is seen along the abdominal aorta and its branches.  The appendix is not characterized. The bladder has been resected. Minimal soft tissue air is noted in tracking into the left inguinal canal, likely also postoperative in nature. No inguinal  lymphadenopathy is seen.  No acute osseous abnormalities are identified. Facet disease is noted at the lower lumbar spine.  IMPRESSION: 1. Diffuse distention of small bowel loops throughout the abdomen, measuring up to 4.4 cm in diameter. No focal transition point seen. Lateral decompression of small bowel at the right mid abdomen, with mild distention of a small bowel anastomosis. The colon is largely filled with fluid. Findings are concerning for small bowel dysmotility and mild ileus. 2. Ileal conduit decompressed and not well assessed. Bilateral ureteral stents are grossly unremarkable in appearance. No evidence of hydronephrosis. 3. Gallbladder distended, as on prior studies, but otherwise grossly unremarkable. Mild prominence of the intrahepatic biliary ducts again noted. The common bile duct remains normal in caliber. 4. Trace free fluid noted within the abdomen and pelvis. 5. Minimal soft tissue air along the anterior abdominal wall and tracking into the left inguinal canal is likely postoperative in nature. 6. Mild focal right basilar atelectasis noted. Scattered residual partially calcified pulmonary nodules are stable or improved from 2013, likely postinfectious in nature. 7. Small right renal cyst seen. 8. Mild scattered calcification along the abdominal aorta and its branches.   Electronically Signed   By: Garald Balding M.D.   On: 01/26/2015 06:23   Dg Chest Port 1 View  01/26/2015   CLINICAL DATA:  PICC line placement.  EXAM: PORTABLE CHEST - 1 VIEW  COMPARISON:  12/01/2014  FINDINGS: Right PICC line is in place. The tip is at the cavoatrial junction. NG tube is in  placed into the stomach. Bilateral pulmonary nodules have increased in size since prior study. Heart is normal size. No effusions. No acute bony abnormality.  IMPRESSION: Right PICC line tip at the cavoatrial junction. NG tube seen entering the stomach.  Worsening bilateral pulmonary nodules.   Electronically Signed   By: Rolm Baptise  M.D.   On: 01/26/2015 15:55    Anti-infectives: Anti-infectives    None      Assessment/Plan:  1 - Post-op Ileus - continue bowel rest with plan for NG, TPN and likely start NG clamping trial 2/11.   2 - Bladder and Urethral Cancer - s/p surgery, considering possible adjuvant therapy in few mos penidng surgical recovery.   3 - Disposition / Rehab - Likley Dc to home with [facility] at discharge this admission.   4 - remain in house.     [facility], Shandrika Ambers 01/27/2015

## 2015-01-27 NOTE — Progress Notes (Addendum)
PT Cancellation Note  Patient Details Name: GERRICK RAY MRN: [id number] DOB: 07-31-1946   Cancelled Treatment:    Reason Eval/Treat Not Completed: Medical issues which prohibited therapy. Patient (just got up to recliner,  Is fatigued and also has NG suction to wall. check back tomorrow , ambulate if OK to clamp NG.)   Claretha Cooper 01/27/2015, 3:02 PM Tresa Endo PT [phone]

## 2015-01-27 NOTE — Progress Notes (Signed)
[facility]  Patient Status: Active (receiving services up to time of hospitalization)  [facility] is providing the following services: RN, PT and OT  If patient discharges after hours, please call [phone].   [name] 01/27/2015, 3:05 PM

## 2015-01-28 LAB — GLUCOSE, CAPILLARY
GLUCOSE-CAPILLARY: 139 mg/dL — AB (ref 70–99)
GLUCOSE-CAPILLARY: 140 mg/dL — AB (ref 70–99)
Glucose-Capillary: 147 mg/dL — ABNORMAL HIGH (ref 70–99)
Glucose-Capillary: 118 mg/dL — ABNORMAL HIGH (ref 70–99)
Glucose-Capillary: 141 mg/dL — ABNORMAL HIGH (ref 70–99)

## 2015-01-28 LAB — BASIC METABOLIC PANEL
Anion gap: 7 (ref 5–15)
BUN: 19 mg/dL (ref 6–23)
CO2: 35 mmol/L — ABNORMAL HIGH (ref 19–32)
Calcium: 8.2 mg/dL — ABNORMAL LOW (ref 8.4–10.5)
Chloride: 101 mmol/L (ref 96–112)
Creatinine, Ser: 0.64 mg/dL (ref 0.50–1.35)
GFR calc Af Amer: >90 mL/min (ref >90)
GLUCOSE: 159 mg/dL — AB (ref 70–99)
POTASSIUM: 2.9 mmol/L — AB (ref 3.5–5.1)
SODIUM: 143 mmol/L (ref 135–145)

## 2015-01-28 LAB — MAGNESIUM: Magnesium: 1.5 mg/dL (ref 1.5–2.5)

## 2015-01-28 LAB — PHOSPHORUS: PHOSPHORUS: 3.1 mg/dL (ref 2.3–4.6)

## 2015-01-28 MED ORDER — KCL-LACTATED RINGERS 20 MEQ/L IV SOLN
INTRAVENOUS | Status: AC
  Administered 2015-01-28 – 2015-01-29 (×2): via INTRAVENOUS
  Filled 2015-01-28 (×4): qty 1000

## 2015-01-28 MED ORDER — FAT EMULSION 20 % IV EMUL
240.0000 mL | INTRAVENOUS | Status: AC
  Administered 2015-01-28: 240 mL via INTRAVENOUS
  Filled 2015-01-28: qty 250

## 2015-01-28 MED ORDER — MAGNESIUM SULFATE IN D5W 10-5 MG/ML-% IV SOLN
1.0000 g | Freq: Once | INTRAVENOUS | Status: AC
  Administered 2015-01-28: 1 g via INTRAVENOUS
  Filled 2015-01-28 (×2): qty 100

## 2015-01-28 MED ORDER — POTASSIUM CHLORIDE 10 MEQ/50ML IV SOLN
10.0000 meq | INTRAVENOUS | Status: AC
  Administered 2015-01-28 (×6): 10 meq via INTRAVENOUS
  Filled 2015-01-28 (×6): qty 50

## 2015-01-28 MED ORDER — TRACE MINERALS CR-CU-F-FE-I-MN-MO-SE-ZN IV SOLN
INTRAVENOUS | Status: AC
  Administered 2015-01-28: 18:00:00 via INTRAVENOUS
  Filled 2015-01-28: qty 960

## 2015-01-28 MED ORDER — FLUCONAZOLE 100MG IVPB
100.0000 mg | Freq: Once | INTRAVENOUS | Status: AC
  Administered 2015-01-28: 100 mg via INTRAVENOUS
  Filled 2015-01-28: qty 50

## 2015-01-28 NOTE — Progress Notes (Signed)
Subjective:  1 - Post-op Ileus - s/p ileal conduit urinary diversion as part of cystectomy 01/16/2015. Discharged 01/22/15 as toll reg diet with resumed bowel function. Readmitted 01/26/2015 with increasing abd distension, CT evidence of likely ileus (no obvious obstruciton / transition point).PICC, NG, TPN started 2/8 for trial of bowel rest.   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   3 - Disposition / Rehab - . Pt lived independantly at [facility]. Has walker, riser for Dc home when ready. Ostomy RN and PT following in house.  Today Mikki Santee is feeling improved as he had multiple BMs yesterday, much less abd distention. NGT output trending down after initial placement. NO fevers.   Objective: Vital signs in last 24 hours: Temp:  [97.6 F (36.4 C)-98.4 F (36.9 C)] 98.2 F (36.8 C) (02/10 0421) Pulse Rate:  [72-86] 86 (02/10 0421) Resp:  [18-19] 18 (02/10 0421) BP: (138-148)/(62-69) 148/67 mmHg (02/10 0421) SpO2:  [95 %-97 %] 95 % (02/10 0421) Last BM Date: 01/27/15 (large loose BM)  Intake/Output from previous day: 02/09 0701 - 02/10 0700 In: 1705 [I.V.:1105; IV Piggyback:200; TPN:400] Out: 2200 [Urine:350; Emesis/NG output:800; Stool:1050] Intake/Output this shift:    General appearance: alert, cooperative, appears stated age and family at bedside Eyes: negative Nose: Nares normal. Septum midline. Mucosa normal. No drainage or sinus tenderness., NGT in place with NON-bilious output.  Throat: lips, mucosa, and tongue normal; teeth and gums normal Neck: supple, symmetrical, trachea midline Back: symmetric, no curvature. ROM normal. No CVA tenderness. Resp: non-labored on room air Cardio: Nl rate GI: decreased distention. RLQ URostomy pink / patent with Bander stents in place.  Male genitalia:  normal Extremities: extremities normal, atraumatic, no cyanosis or edema Pulses: 2+ and symmetric Skin: Skin color, texture, turgor normal. No rashes or lesions Lymph nodes: Cervical, supraclavicular, and axillary nodes normal. Neurologic: Grossly normal Incision/Wound: recent abdominal and perineal incision sites c/d/i.   Lab Results:   Recent Labs  01/26/15 1030 01/27/15 0420  WBC 9.2 7.6  HGB 13.0 10.8*  HCT 38.6* 32.6*  PLT 283 237   BMET  Recent Labs  01/27/15 0420 01/28/15 0445  NA 142 143  K 3.3* 2.9*  CL 101 101  CO2 33* 35*  GLUCOSE 152* 159*  BUN 37* 19  CREATININE 0.84 0.64  CALCIUM 8.3* 8.2*   PT/INR No results for input(s): LABPROT, INR in the last 72 hours. ABG No results for input(s): PHART, HCO3 in the last 72 hours.  Invalid input(s): PCO2, PO2  Studies/Results: Dg Abd 1 View  01/27/2015   CLINICAL DATA:  Abdominal distension, discomfort, and loose stools, recent onset. ; history of bladder and urethral malignancy with subsequent urinary diversion.  EXAM: ABDOMEN - 1 VIEW  COMPARISON:  Abdominal series of January 26, 2015  FINDINGS: The nasogastric tube tip projects in the region of the distal stomach. The stomach is nondistended. There are loops of mildly distended gas-filled small bowel in the mid abdomen. The colonic distention demonstrated yesterday has resolved. There is a small amount of gas and stool in the rectum. There are double pigtail stents present. The stent on the right appears to terminate over the mid sacrum. The stent on the left terminates above the right iliac crest.  IMPRESSION: 1. Interval improvement in the gaseous distention of the colon. There remains mild small bowel distention. No free extraluminal gas collections  are demonstrated. 2. Bilateral single pigtail stents revealing evidence of the previous urinary diversion. The distal tips of the stents appear to be mobile.   Electronically Signed   By: David  Martinique   On: 01/27/2015  12:10   Dg Chest Port 1 View  01/26/2015   CLINICAL DATA:  PICC line placement.  EXAM: PORTABLE CHEST - 1 VIEW  COMPARISON:  12/01/2014  FINDINGS: Right PICC line is in place. The tip is at the cavoatrial junction. NG tube is in placed into the stomach. Bilateral pulmonary nodules have increased in size since prior study. Heart is normal size. No effusions. No acute bony abnormality.  IMPRESSION: Right PICC line tip at the cavoatrial junction. NG tube seen entering the stomach.  Worsening bilateral pulmonary nodules.   Electronically Signed   By: Rolm Baptise M.D.   On: 01/26/2015 15:55    Anti-infectives: Anti-infectives    None      Assessment/Plan:  1 - Post-op Ileus - continue bowel rest with plan for NG, TPN and likely start NG clamping trial this PM. NGT out 2/11 if residuals low.  2 - Bladder and Urethral Cancer - s/p surgery, considering possible adjuvant therapy in few mos penidng surgical recovery.   3 - Disposition / Rehab - Likley Dc to home with [facility] at discharge this admission.   4 - remain in house.   FEN/GI: hypokalemic alkalosis. Likely from NGT suction. Repleting IV. Appreciate TPN pharmacy help in this regard.     [name] 01/28/2015

## 2015-01-28 NOTE — Progress Notes (Signed)
PARENTERAL NUTRITION CONSULT NOTE - follow up  Pharmacy Consult for TPN Indication: Prolonged Ileus  No Known Allergies  Patient Measurements: Height: 6\' 2"  (188 cm) Weight: 258 lb (117.028 kg) IBW/kg (Calculated) : 82.2  Vital Signs: Temp: 98.2 F (36.8 C) (02/10 0421) Temp Source: Oral (02/10 0421) BP: 148/67 mmHg (02/10 0421) Pulse Rate: 86 (02/10 0421) Intake/Output from previous day: 02/09 0701 - 02/10 0700 In: 1705 [I.V.:1105; IV Piggyback:200; TPN:400] Out: 2200 [Urine:350; Emesis/NG output:800; Stool:1050] Intake/Output from this shift:    Labs:  Recent Labs  01/26/15 0358 01/26/15 1030 01/27/15 0420  WBC 12.2* 9.2 7.6  HGB 14.1 13.0 10.8*  HCT 41.7 38.6* 32.6*  PLT 316 283 237     Recent Labs  01/26/15 0358 01/26/15 1030 01/27/15 0420 01/28/15 0445  NA 140 136 142 143  K 3.8 3.8 3.3* 2.9*  CL 99 96 101 101  CO2 27 30 33* 35*  GLUCOSE 166* 146* 152* 159*  BUN 37* 43* 37* 19  CREATININE 1.09 1.25 0.84 0.64  CALCIUM 9.3 8.6 8.3* 8.2*  MG  --   --  1.9 1.5  PHOS  --   --  3.5 3.1  PROT 6.4  --  5.2*  --   ALBUMIN 2.8*  --  2.1*  --   AST 24  --  16  --   ALT 15  --  12  --   ALKPHOS 58  --  108  --   BILITOT 1.3*  --  0.6  --   PREALBUMIN  --   --  8.9*  --   TRIG  --   --  331*  --    Estimated Creatinine Clearance: 118.5 mL/min (by C-G formula based on Cr of 0.64).    Recent Labs  01/27/15 2332 01/28/15 0414 01/28/15 0724  GLUCAP 125* 139* 147*    Medical History: Past Medical History  Diagnosis Date  . Hypertension   . Hyperlipidemia   . Diabetes mellitus, type 2   . GERD (gastroesophageal reflux disease)   . Frequency of urination   . Urgency of urination   . Nocturia   . Bladder cancer   . Multiple pulmonary nodules BILATERAL -- NOT METS PER PET SCAN    Medications:  Scheduled:  . heparin subcutaneous  5,000 Units Subcutaneous 3 times per day  . insulin aspart  0-15 Units Subcutaneous 6 times per day  . magnesium  sulfate 1 - 4 g bolus IVPB  1 g Intravenous Once  . pantoprazole (PROTONIX) IV  40 mg Intravenous Q24H  . potassium chloride  10 mEq Intravenous Q1 Hr x 6   Infusions:  . Marland KitchenTPN (CLINIMIX-E) Adult 40 mL/hr at 01/27/15 1749   And  . fat emulsion 240 mL (01/27/15 1749)  . lactated ringers 85 mL/hr at 01/27/15 2134      Insulin Requirements: 13 Units yesterday  Current Nutrition: TPN  IVF: LR @ 44ml/hr  Central access: PICC placed 2/8 TPN start date: 2/8  ASSESSMENT  HPI:  26M s/p cystoprostectomy with ileal conduit recently discharged (2/4) who presents to the ED with nausea/vomitting/failure to thrive at home.  CT scan shows diffuse ileus.  Pharmacy consulted to start TPN.  Significant events:   Today:    Glucose - 159  Electrolytes - K 2.9, CCa 9.7, Mag 1.5  all other WNL  Renal - Scr 0.64, CrCl >178mls/min  LFTs - all WNL  TGs - 331 (2/9)  Prealbumin-8.9  NUTRITIONAL GOALS                                                                                             RD recs:  Clinimix E 5/15 at a goal rate of 100 ml/hr + 20% fat emulsion at 70ml/hr to provide:120g/day protein, 2184Kcal/day.  PLAN                                                                                                                         At 1800 today:  Continue Clinimix E 5/15 at 49ml/hr, will watch lytes and advance when stabilize.  20% fat emulsion at 17ml/hr, follow triglycerides, will recheck with am labs  Plan to advance as tolerated to the goal rate.  TPN to contain standard multivitamins and trace elements.  Change IV fluids to LR w/ 78meq KCl at 85 ml/hr.  KCl 61meq IV x 6  Mag Sulfate 1gm IV x 1  Continue moderate SSI .   TPN lab panels on Mondays & Thursdays.  F/u daily.   Dolly Rias RPh 01/28/2015, 10:27 AM Pager [phone]

## 2015-01-28 NOTE — Progress Notes (Signed)
PT Cancellation Note  Patient Details Name: KAJUAN GUYTON MRN: [id number] DOB: 1946-12-15   Cancelled Treatment:    Reason Eval/Treat Not Completed: Medical issues which prohibited therapy (up in recliner, remains on NG suction, chart reviewed. will be able to  ambulate once able to clamp NG, which my be soon.)will check on patient in AM.   Claretha Cooper 01/28/2015, 3:17 PM Tresa Endo PT [phone]

## 2015-01-28 NOTE — Consult Note (Signed)
WOC ostomy follow up Stoma type/location: RUQ urostomy Stomal assessment/size: 1 inch x 1 and 1/2 inch oval. 2 stents intact Peristomal assessment: Not seen today Treatment options for stomal/peristomal skin: Hydrocolloid, skin barrier ring and convex pouching system in place Output clear yellow urine Ostomy pouching: 1pc.with skin barrier ring Education provided: Patient supported.He is just trying to "get through" the time with this NGT.  Dosing.  Significant other has just left room for a short break.  Samples of catheter securement straps provided for home use. Enrolled patient in [place] Start Discharge program: Yes [place] nursing team will follow, and will remain available to this patient, the nursing, surgical and medical teams.  Thanks, Maudie Flakes, MSN, RN, [place], [place], [place] [phone])

## 2015-01-29 LAB — COMPREHENSIVE METABOLIC PANEL
ALK PHOS: 47 U/L (ref 39–117)
ALT: 10 U/L (ref 0–53)
AST: 14 U/L (ref 0–37)
Albumin: 2.3 g/dL — ABNORMAL LOW (ref 3.5–5.2)
Anion gap: 7 (ref 5–15)
BILIRUBIN TOTAL: 0.6 mg/dL (ref 0.3–1.2)
BUN: 17 mg/dL (ref 6–23)
CO2: 32 mmol/L (ref 19–32)
Calcium: 8.3 mg/dL — ABNORMAL LOW (ref 8.4–10.5)
Chloride: 104 mmol/L (ref 96–112)
Creatinine, Ser: 0.64 mg/dL (ref 0.50–1.35)
GFR calc Af Amer: >90 mL/min (ref >90)
GFR calc non Af Amer: >90 mL/min (ref >90)
Glucose, Bld: 163 mg/dL — ABNORMAL HIGH (ref 70–99)
POTASSIUM: 3.4 mmol/L — AB (ref 3.5–5.1)
SODIUM: 143 mmol/L (ref 135–145)
Total Protein: 5.4 g/dL — ABNORMAL LOW (ref 6.0–8.3)

## 2015-01-29 LAB — GLUCOSE, CAPILLARY
GLUCOSE-CAPILLARY: 143 mg/dL — AB (ref 70–99)
Glucose-Capillary: 119 mg/dL — ABNORMAL HIGH (ref 70–99)
Glucose-Capillary: 129 mg/dL — ABNORMAL HIGH (ref 70–99)
Glucose-Capillary: 131 mg/dL — ABNORMAL HIGH (ref 70–99)
Glucose-Capillary: 134 mg/dL — ABNORMAL HIGH (ref 70–99)
Glucose-Capillary: 144 mg/dL — ABNORMAL HIGH (ref 70–99)

## 2015-01-29 LAB — PHOSPHORUS: Phosphorus: 3.2 mg/dL (ref 2.3–4.6)

## 2015-01-29 LAB — TRIGLYCERIDES: Triglycerides: 408 mg/dL — ABNORMAL HIGH (ref <150)

## 2015-01-29 LAB — MAGNESIUM: Magnesium: 1.6 mg/dL (ref 1.5–2.5)

## 2015-01-29 MED ORDER — DIPHENHYDRAMINE HCL 25 MG PO CAPS
25.0000 mg | ORAL_CAPSULE | Freq: Every evening | ORAL | Status: DC | PRN
  Administered 2015-01-29: 25 mg via ORAL
  Filled 2015-01-29: qty 1

## 2015-01-29 MED ORDER — FAT EMULSION 20 % IV EMUL
240.0000 mL | INTRAVENOUS | Status: AC
  Administered 2015-01-29: 240 mL via INTRAVENOUS
  Filled 2015-01-29: qty 250

## 2015-01-29 MED ORDER — MAGNESIUM SULFATE IN D5W 10-5 MG/ML-% IV SOLN
1.0000 g | Freq: Once | INTRAVENOUS | Status: AC
  Administered 2015-01-29: 1 g via INTRAVENOUS
  Filled 2015-01-29: qty 100

## 2015-01-29 MED ORDER — POTASSIUM CHLORIDE 2 MEQ/ML IV SOLN
INTRAVENOUS | Status: AC
  Administered 2015-01-29 – 2015-01-30 (×2): via INTRAVENOUS
  Filled 2015-01-29 (×4): qty 1000

## 2015-01-29 MED ORDER — POTASSIUM CHLORIDE 10 MEQ/50ML IV SOLN
10.0000 meq | INTRAVENOUS | Status: AC
  Administered 2015-01-29 (×4): 10 meq via INTRAVENOUS
  Filled 2015-01-29 (×4): qty 50

## 2015-01-29 MED ORDER — TRACE MINERALS CR-CU-F-FE-I-MN-MO-SE-ZN IV SOLN
INTRAVENOUS | Status: AC
  Administered 2015-01-29: 17:00:00 via INTRAVENOUS
  Filled 2015-01-29: qty 960

## 2015-01-29 NOTE — Progress Notes (Signed)
PARENTERAL NUTRITION CONSULT NOTE - follow up  Pharmacy Consult for TPN Indication: Prolonged Ileus  No Known Allergies  Patient Measurements: Height: 6\' 2"  (188 cm) Weight: 258 lb (117.028 kg) IBW/kg (Calculated) : 82.2  Vital Signs: Temp: 98.1 F (36.7 C) (02/11 0414) Temp Source: Oral (02/11 0414) BP: 150/78 mmHg (02/11 0414) Pulse Rate: 78 (02/11 0414) Intake/Output from previous day: 02/10 0701 - 02/11 0700 In: 2212.8 [I.V.:1362.8; IV Piggyback:450; TPN:400] Out: 1925 [Emesis/NG output:700; Stool:1225] Intake/Output from this shift:    Labs:  Recent Labs  01/26/15 1030 01/27/15 0420  WBC 9.2 7.6  HGB 13.0 10.8*  HCT 38.6* 32.6*  PLT 283 237     Recent Labs  01/27/15 0420 01/28/15 0445 01/29/15 0403 01/29/15 0409  NA 142 143 143  --   K 3.3* 2.9* 3.4*  --   CL 101 101 104  --   CO2 33* 35* 32  --   GLUCOSE 152* 159* 163*  --   BUN 37* 19 17  --   CREATININE 0.84 0.64 0.64  --   CALCIUM 8.3* 8.2* 8.3*  --   MG 1.9 1.5 1.6  --   PHOS 3.5 3.1 3.2  --   PROT 5.2*  --  5.4*  --   ALBUMIN 2.1*  --  2.3*  --   AST 16  --  14  --   ALT 12  --  10  --   ALKPHOS 108  --  47  --   BILITOT 0.6  --  0.6  --   PREALBUMIN 8.9*  --   --   --   TRIG 331*  --   --  408*   Estimated Creatinine Clearance: 118.5 mL/min (by C-G formula based on Cr of 0.64).    Recent Labs  01/29/15 0021 01/29/15 0409 01/29/15 0736  GLUCAP 129* 144* 143*    Medical History: Past Medical History  Diagnosis Date  . Hypertension   . Hyperlipidemia   . Diabetes mellitus, type 2   . GERD (gastroesophageal reflux disease)   . Frequency of urination   . Urgency of urination   . Nocturia   . Bladder cancer   . Multiple pulmonary nodules BILATERAL -- NOT METS PER PET SCAN    Medications:  Scheduled:  . heparin subcutaneous  5,000 Units Subcutaneous 3 times per day  . insulin aspart  0-15 Units Subcutaneous 6 times per day  . magnesium sulfate 1 - 4 g bolus IVPB  1 g  Intravenous Once  . pantoprazole (PROTONIX) IV  40 mg Intravenous Q24H  . potassium chloride  10 mEq Intravenous Q1 Hr x 4   Infusions:  . Marland KitchenTPN (CLINIMIX-E) Adult 40 mL/hr at 01/28/15 1815   And  . fat emulsion 240 mL (01/28/15 1815)  . lactated ringers with KCl 20 mEq/L 85 mL/hr at 01/29/15 0827      Insulin Requirements: 10 Units yesterday  Current Nutrition: TPN  IVF: LR 30meq KCl @ 78ml/hr  Central access: PICC placed 2/8 TPN start date: 2/8  ASSESSMENT  HPI:  55M s/p cystoprostectomy with ileal conduit recently discharged (2/4) who presents to the ED with nausea/vomitting/failure to thrive at home.  CT scan shows diffuse ileus.  Pharmacy consulted to start TPN.  Significant events:  2/11 clear liquid diet started  Today:    Glucose - 163  Electrolytes - K 3.4, CCa 9.7, Mag 1.6  all other WNL  Renal - Scr 0.64, CrCl >146mls/min  LFTs - all WNL  TGs - 408   Prealbumin-8.9 (2/9)  NUTRITIONAL GOALS                                                                                             RD recs:  Clinimix E 5/15 at a goal rate of 100 ml/hr + 20% fat emulsion at 25ml/hr to provide:120g/day protein, 2184Kcal/day.  PLAN                                                                                                                         At 1800 today:  Continue Clinimix E 5/15 at 82ml/hr, will watch lytes and advance when stabilize.  20% fat emulsion at 9ml/hr, due to TG>400 will only give fats on M,W,F  Plan to advance as tolerated to the goal rate.  TPN to contain standard multivitamins and trace elements.  Change IV fluids to LR w/ 38meq KCl at 85 ml/hr.  KCl 50meq IV x 4  Mag Sulfate 1gm IV x 1  Continue moderate SSI .   BMet, mag and phos in am  TPN lab panels on Mondays & Thursdays.  F/u daily.   Dolly Rias RPh 01/29/2015, 10:15  AM Pager [phone]

## 2015-01-29 NOTE — Progress Notes (Signed)
Pt complained of insomnia, MD notified, Benadrly ordered received.SRP, RN

## 2015-01-29 NOTE — Progress Notes (Signed)
Subjective:  1 - Post-op Ileus - s/p ileal conduit urinary diversion as part of cystectomy 01/16/2015. Discharged 01/22/15 as toll reg diet with resumed bowel function. Readmitted 01/26/2015 with increasing abd distension, CT evidence of likely ileus (no obvious obstruciton / transition point).PICC, NG, TPN started 2/8 for trial of bowel rest.   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   3 - Disposition / Rehab - . [facility]. Has walker, riser for Dc home when ready. Ostomy RN and PT following in house.  Today Mario Reese is feeling stable as he continues to have liquidy bowel movements. The NGT was clamped at1700 last night, and he feels a bit more abdominal distension, but not as much as when he came in. No nausea. Continued flatus.  NO fevers. Residual from NGT "54mL"  Objective: Vital signs in last 24 hours: Temp:  [97.7 F (36.5 C)-98.1 F (36.7 C)] 98.1 F (36.7 C) (02/11 0414) Pulse Rate:  [75-78] 78 (02/11 0414) Resp:  [18-19] 19 (02/11 0414) BP: (150-164)/(75-78) 150/78 mmHg (02/11 0414) SpO2:  [92 %-98 %] 92 % (02/11 0414) Last BM Date: 01/28/15  Intake/Output from previous day: 02/10 0701 - 02/11 0700 In: 2212.8 [I.V.:1362.8; IV Piggyback:450; TPN:400] Out: 1925 [Emesis/NG output:700; Stool:1225] Intake/Output this shift:    General appearance: alert, cooperative, appears stated age and family at bedside Eyes: negative Nose: Nares normal. Septum midline. Mucosa normal. No drainage or sinus tenderness., NGT in place with NON-bilious output.  Throat: lips, mucosa, and tongue normal; teeth and gums normal Neck: supple, symmetrical, trachea midline Back: symmetric, no curvature. ROM normal. No CVA tenderness. Resp: non-labored on room air Cardio: Nl rate GI: decreased distention.  RLQ URostomy pink / patent with Bander stents in place.  Male genitalia: normal Extremities: extremities normal, atraumatic, no cyanosis or edema Pulses: 2+ and symmetric Skin: Skin color, texture, turgor normal. No rashes or lesions Lymph nodes: Cervical, supraclavicular, and axillary nodes normal. Neurologic: Grossly normal Incision/Wound: recent abdominal and perineal incision sites c/d/i.   Lab Results:   Recent Labs  01/26/15 1030 01/27/15 0420  WBC 9.2 7.6  HGB 13.0 10.8*  HCT 38.6* 32.6*  PLT 283 237   BMET  Recent Labs  01/28/15 0445 01/29/15 0403  NA 143 143  K 2.9* 3.4*  CL 101 104  CO2 35* 32  GLUCOSE 159* 163*  BUN 19 17  CREATININE 0.64 0.64  CALCIUM 8.2* 8.3*   PT/INR No results for input(s): LABPROT, INR in the last 72 hours. ABG No results for input(s): PHART, HCO3 in the last 72 hours.  Invalid input(s): PCO2, PO2  Studies/Results: Dg Abd 1 View  01/27/2015   CLINICAL DATA:  Abdominal distension, discomfort, and loose stools, recent onset. ; history of bladder and urethral malignancy with subsequent urinary diversion.  EXAM: ABDOMEN - 1 VIEW  COMPARISON:  Abdominal series of January 26, 2015  FINDINGS: The nasogastric tube tip projects in the region of the distal stomach. The stomach is nondistended. There are loops of mildly distended gas-filled small bowel in the mid abdomen. The colonic distention demonstrated yesterday has resolved. There is a small amount of gas and stool in the rectum. There are double pigtail stents present. The stent on the right appears to terminate over the mid sacrum. The stent on the left terminates above the right iliac crest.  IMPRESSION: 1. Interval  improvement in the gaseous distention of the colon. There remains mild small bowel distention. No free extraluminal gas collections are demonstrated. 2. Bilateral single pigtail stents revealing evidence of the previous urinary diversion. The distal tips of the stents appear to be  mobile.   Electronically Signed   By: David  Martinique   On: 01/27/2015 12:10    Anti-infectives: Anti-infectives    Start     Dose/Rate Route Frequency Ordered Stop   01/28/15 1800  fluconazole (DIFLUCAN) IVPB 100 mg     100 mg 50 mL/hr over 60 Minutes Intravenous  Once 01/28/15 1631 01/28/15 1948      Assessment/Plan:  1 - Post-op Ileus - Resolving, NG residual zero therefore removed and starting clears.   2 - Bladder and Urethral Cancer - s/p surgery, considering possible adjuvant therapy in few mos penidng surgical recovery.   3 - Disposition / Rehab - Likley Dc to home with [facility] at discharge this admission.   4 - remain in house.   FEN/GI: hypokalemic alkalosis. Likely from NGT suction. Repleting IV. Resolving Appreciate TPN pharmacy help in this regard.     [name] 01/29/2015

## 2015-01-30 LAB — BASIC METABOLIC PANEL
ANION GAP: 10 (ref 5–15)
BUN: 16 mg/dL (ref 6–23)
CO2: 30 mmol/L (ref 19–32)
Calcium: 8.5 mg/dL (ref 8.4–10.5)
Chloride: 104 mmol/L (ref 96–112)
Creatinine, Ser: 0.66 mg/dL (ref 0.50–1.35)
GFR calc Af Amer: >90 mL/min (ref >90)
GLUCOSE: 157 mg/dL — AB (ref 70–99)
POTASSIUM: 4 mmol/L (ref 3.5–5.1)
SODIUM: 144 mmol/L (ref 135–145)

## 2015-01-30 LAB — GLUCOSE, CAPILLARY
GLUCOSE-CAPILLARY: 117 mg/dL — AB (ref 70–99)
GLUCOSE-CAPILLARY: 124 mg/dL — AB (ref 70–99)
GLUCOSE-CAPILLARY: 128 mg/dL — AB (ref 70–99)
GLUCOSE-CAPILLARY: 130 mg/dL — AB (ref 70–99)
GLUCOSE-CAPILLARY: 151 mg/dL — AB (ref 70–99)
Glucose-Capillary: 128 mg/dL — ABNORMAL HIGH (ref 70–99)
Glucose-Capillary: 125 mg/dL — ABNORMAL HIGH (ref 70–99)

## 2015-01-30 LAB — PHOSPHORUS: PHOSPHORUS: 3.1 mg/dL (ref 2.3–4.6)

## 2015-01-30 LAB — MAGNESIUM: Magnesium: 1.6 mg/dL (ref 1.5–2.5)

## 2015-01-30 MED ORDER — SIMETHICONE 80 MG PO CHEW
80.0000 mg | CHEWABLE_TABLET | Freq: Two times a day (BID) | ORAL | Status: DC
  Administered 2015-01-30 – 2015-02-01 (×5): 80 mg via ORAL
  Filled 2015-01-30 (×5): qty 1

## 2015-01-30 MED ORDER — TRACE MINERALS CR-CU-F-FE-I-MN-MO-SE-ZN IV SOLN
INTRAVENOUS | Status: AC
  Filled 2015-01-30: qty 960

## 2015-01-30 MED ORDER — LACTATED RINGERS IV SOLN
INTRAVENOUS | Status: DC
  Administered 2015-01-30 – 2015-01-31 (×2): via INTRAVENOUS

## 2015-01-30 NOTE — Progress Notes (Signed)
PARENTERAL NUTRITION CONSULT NOTE - follow up  Pharmacy Consult for TPN Indication: Prolonged Ileus  No Known Allergies  Patient Measurements: Height: 6\' 2"  (188 cm) Weight: 258 lb (117.028 kg) IBW/kg (Calculated) : 82.2  Vital Signs: Temp: 98 F (36.7 C) (02/12 0424) Temp Source: Oral (02/12 0424) BP: 162/79 mmHg (02/12 0424) Pulse Rate: 75 (02/12 0424) Intake/Output from previous day: 02/11 0701 - 02/12 0700 In: 670 [P.O.:120; I.V.:550] Out: 2050 [Urine:1150; Stool:900] Intake/Output from this shift:    Labs: No results for input(s): WBC, HGB, HCT, PLT, APTT, INR in the last 72 hours.   Recent Labs  01/28/15 0445 01/29/15 0403 01/29/15 0409 01/30/15 0525  NA 143 143  --  144  K 2.9* 3.4*  --  4.0  CL 101 104  --  104  CO2 35* 32  --  30  GLUCOSE 159* 163*  --  157*  BUN 19 17  --  16  CREATININE 0.64 0.64  --  0.66  CALCIUM 8.2* 8.3*  --  8.5  MG 1.5 1.6  --  1.6  PHOS 3.1 3.2  --  3.1  PROT  --  5.4*  --   --   ALBUMIN  --  2.3*  --   --   AST  --  14  --   --   ALT  --  10  --   --   ALKPHOS  --  47  --   --   BILITOT  --  0.6  --   --   TRIG  --   --  408*  --    Estimated Creatinine Clearance: 118.5 mL/min (by C-G formula based on Cr of 0.66).    Recent Labs  01/30/15 0044 01/30/15 0417 01/30/15 0746  GLUCAP 117* 151* 130*    Medical History: Past Medical History  Diagnosis Date  . Hypertension   . Hyperlipidemia   . Diabetes mellitus, type 2   . GERD (gastroesophageal reflux disease)   . Frequency of urination   . Urgency of urination   . Nocturia   . Bladder cancer   . Multiple pulmonary nodules BILATERAL -- NOT METS PER PET SCAN    Medications:  Scheduled:  . heparin subcutaneous  5,000 Units Subcutaneous 3 times per day  . insulin aspart  0-15 Units Subcutaneous 6 times per day  . pantoprazole (PROTONIX) IV  40 mg Intravenous Q24H  . simethicone  80 mg Oral BID   Infusions:  . Marland KitchenTPN (CLINIMIX-E) Adult 40 mL/hr at 01/29/15  1709   And  . fat emulsion 240 mL (01/29/15 1709)  . lactated ringers with kcl 85 mL/hr at 01/30/15 0448      Insulin Requirements: 10 Units yesterday  Current Nutrition: TPN  IVF: LR 64meq KCl @ 38ml/hr  Central access: PICC placed 2/8 TPN start date: 2/8  ASSESSMENT                                                                                                          HPI:  40M s/p cystoprostectomy with ileal conduit recently discharged (2/4) who presents to the ED with nausea/vomitting/failure to thrive at home.  CT scan shows diffuse ileus.  Pharmacy consulted to start TPN.  Significant events:  2/11 clear liquid diet started 2/12: TPN wean to off  Today:    Glucose - 157  Electrolytes - K 4.0, Ca 8.5 Mag 1.6  all other WNL  Renal - Scr 0.64, CrCl >123mls/min  LFTs - all WNL  TGs - 408   Prealbumin-8.9 (2/9)  NUTRITIONAL GOALS                                                                                             RD recs:  Clinimix E 5/15 at a goal rate of 100 ml/hr + 20% fat emulsion at 52ml/hr to provide:120g/day protein, 2184Kcal/day.  PLAN                                                                                                                           At 1600 Decrease Clinimix E 5/15 to 44ml/hr for 2 hours then stop  D/C 20% fat emulsion @ 1800  Change IV fluids to LR @ 85 ml/hr @ 1800  Continue moderate SSI .   D/C TPN lab panels    Dolly Rias RPh 01/30/2015, 11:47 AM Pager [phone]

## 2015-01-30 NOTE — Progress Notes (Signed)
  Subjective:  1 - Post-op Ileus - s/p ileal conduit urinary diversion as part of cystectomy 01/16/2015. Discharged 01/22/15 as toll reg diet with resumed bowel function. Readmitted 01/26/2015 with increasing abd distension, CT evidence of likely ileus (no obvious obstruciton / transition point).PICC, NG, TPN started 2/8 for trial of bowel rest. NGT removed after successful clamping trial 2/11. Advanced to fulls 2/12.   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   3 - Disposition / Rehab - . [facility]. Has walker, riser for Dc home when ready. Ostomy RN and PT following in house.  Today Mario Reese is feeling stable as he continues to have liquidy bowel movements and pass flatus. t. He drank clears yesterday without nausea/vomiting. NO fevers.  Objective: Vital signs in last 24 hours: Temp:  [97.6 F (36.4 C)-98.5 F (36.9 C)] 98 F (36.7 C) (02/12 0424) Pulse Rate:  [73-77] 75 (02/12 0424) Resp:  [20] 20 (02/12 0424) BP: (153-166)/(77-80) 162/79 mmHg (02/12 0424) SpO2:  [93 %-95 %] 93 % (02/12 0424) Last BM Date: 02/02/15  Intake/Output from previous day: 02/11 0701 - 02/12 0700 In: 670 [P.O.:120; I.V.:550] Out: 2050 [Urine:1150; Stool:900] Intake/Output this shift:    General appearance: alert, cooperative, appears stated age and family at bedside Eyes: negative Nose: Nares normal. Septum midline. Mucosa normal. No drainage or sinus tenderness., NGT removed Throat: lips, mucosa, and tongue normal; teeth and gums normal Neck: supple, symmetrical, trachea midline Back: symmetric, no curvature. ROM normal. No CVA tenderness. Resp: clear to auscultation bilaterally and non-labored on room air Cardio: Nl rate GI: stable to slightly increased distention. RLQ URostomy pink / patent with Bander  stents in place.  Male genitalia: normal Extremities: extremities normal, atraumatic, no cyanosis or edema Pulses: 2+ and symmetric Skin: Skin color, texture, turgor normal. No rashes or lesions Lymph nodes: Cervical, supraclavicular, and axillary nodes normal. Neurologic: Grossly normal Incision/Wound: recent abdominal and perineal incision sites c/d/i.   Lab Results:  No results for input(s): WBC, HGB, HCT, PLT in the last 72 hours. BMET  Recent Labs  01/29/15 0403 01/30/15 0525  NA 143 144  K 3.4* 4.0  CL 104 104  CO2 32 30  GLUCOSE 163* 157*  BUN 17 16  CREATININE 0.64 0.66  CALCIUM 8.3* 8.5   PT/INR No results for input(s): LABPROT, INR in the last 72 hours. ABG No results for input(s): PHART, HCO3 in the last 72 hours.  Invalid input(s): PCO2, PO2  Studies/Results: No results found.  Anti-infectives: Anti-infectives    Start     Dose/Rate Route Frequency Ordered Stop   01/28/15 1800  fluconazole (DIFLUCAN) IVPB 100 mg     100 mg 50 mL/hr over 60 Minutes Intravenous  Once 01/28/15 1631 01/28/15 1948      Assessment/Plan:  1 - Post-op Ileus - Resolving, NG residual zero 2/11 and tolerating clears. Will try fulls today. WEan off TPN.  2 - Bladder and Urethral Cancer - s/p surgery, considering possible adjuvant therapy in few mos penidng surgical recovery.   3 - Disposition / Rehab - Likley Dc to home with [facility] at discharge this admission.   4 - remain in house.      [name] 01/30/2015

## 2015-01-30 NOTE — Consult Note (Signed)
WOC ostomy follow up: Late entry for visit made on 01/29/15 Stoma type/location: RUQ urostomy with 2 stents intact Stomal assessment/size: 1 inch x 1 and 1/2 inch oval.  Slightly elevated Peristomal assessment: Previous blisters resolving.  Area of erythema at 11 o'clock is mild. Treatment options for stomal/peristomal skin: Hydrocolloid dressing.  SKin barrier ring from 3-9 o'clock and also extending toward midline at 3-o'clock to fill in crease. Output Clear yellow urine Ostomy pouching: 1pc.convex urinary pouch with skin barrier rings used to enhance convexity and fill crease.  Hydrocolloid dressing may not be required after this pouching change Education provided: Patient and significant other participated in pouching change; patient places hand over pouch to warm adhesive.  Significant other has performed pouch change independently at home x1. She watches today as I fill the midline crease with barrier ring. Enrolled patient in [place] Discharge program: [place] nursing team will not follow, but will remain available to this patient, the nursing and medical team.  Please re-consult if needed. Thanks, Maudie Flakes, MSN, RN, [place], [place], [place] [phone])

## 2015-01-30 NOTE — Progress Notes (Signed)
PT Cancellation Note/ Screen  Patient Details Name: AJAY STRUBEL MRN: [id number] DOB: 1946/10/30   Cancelled Treatment:    Reason Eval/Treat Not Completed: PT screened, no needs identified, will sign off  Pt sleeping upon entering room.  RN reports pt ambulating in hallway with spouse.  Spouse reports she feels comfortable ambulating with pt and no PT needs at this time.  PT to sign off.   Keydi Giel,KATHrine E 01/30/2015, 1:45 PM  Carmelia Bake, PT, DPT 01/30/2015 Pager: [phone]

## 2015-01-31 LAB — GLUCOSE, CAPILLARY
GLUCOSE-CAPILLARY: 138 mg/dL — AB (ref 70–99)
Glucose-Capillary: 113 mg/dL — ABNORMAL HIGH (ref 70–99)
Glucose-Capillary: 126 mg/dL — ABNORMAL HIGH (ref 70–99)
Glucose-Capillary: 118 mg/dL — ABNORMAL HIGH (ref 70–99)

## 2015-01-31 MED ORDER — PANTOPRAZOLE SODIUM 40 MG PO TBEC
40.0000 mg | DELAYED_RELEASE_TABLET | Freq: Every day | ORAL | Status: DC
  Administered 2015-01-31 – 2015-02-01 (×2): 40 mg via ORAL
  Filled 2015-01-31 (×2): qty 1

## 2015-01-31 NOTE — Progress Notes (Signed)
  Subjective:  1 - Post-op Ileus - s/p ileal conduit urinary diversion as part of cystectomy 01/16/2015. Discharged 01/22/15 as toll reg diet with resumed bowel function. Readmitted 01/26/2015 with increasing abd distension, CT evidence of likely ileus (no obvious obstruciton / transition point).PICC, NG, TPN started 2/8 for trial of bowel rest. NGT removed after successful clamping trial 2/11. Advanced to fulls 2/12.   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   3 - Disposition / Rehab - . Pt lived independantly at [facility]. Has walker, riser for Dc home when ready. Ostomy RN and PT following in house.  Today Mario Reese is feeling stable as he continues to have liquidy bowel movements and pass flatus. t. He drank full clears yesterday without nausea/vomiting. NO fevers. Abdominal distension is stable.  Objective: Vital signs in last 24 hours: Temp:  [97.9 F (36.6 C)-98.2 F (36.8 C)] 98.2 F (36.8 C) (02/13 0457) Pulse Rate:  [76-82] 76 (02/13 0457) Resp:  [20] 20 (02/13 0457) BP: (157)/(79-86) 157/79 mmHg (02/13 0457) SpO2:  [94 %-95 %] 94 % (02/13 0457) Last BM Date: 01/30/15  Intake/Output from previous day: 02/12 0701 - 02/13 0700 In: 1130.6 [I.V.:1130.6] Out: 240 [Stool:240] Intake/Output this shift:    General appearance: alert, cooperative, appears stated age and family at bedside Eyes: negative Nose: Nares normal. Septum midline. Mucosa normal. No drainage or sinus tenderness., NGT removed Throat: lips, mucosa, and tongue normal; teeth and gums normal Neck: supple, symmetrical, trachea midline Back: symmetric, no curvature. ROM normal. No CVA tenderness. Resp: clear to auscultation bilaterally and non-labored on room air Cardio: Nl rate GI: stable to slightly increased distention. RLQ URostomy pink /  patent with Bander stents in place.  Male genitalia: normal Extremities: extremities normal, atraumatic, no cyanosis or edema Pulses: 2+ and symmetric Skin: Skin color, texture, turgor normal. No rashes or lesions Lymph nodes: Cervical, supraclavicular, and axillary nodes normal. Neurologic: Grossly normal Incision/Wound: recent abdominal and perineal incision sites c/d/i.   Lab Results:  No results for input(s): WBC, HGB, HCT, PLT in the last 72 hours. BMET  Recent Labs  01/29/15 0403 01/30/15 0525  NA 143 144  K 3.4* 4.0  CL 104 104  CO2 32 30  GLUCOSE 163* 157*  BUN 17 16  CREATININE 0.64 0.66  CALCIUM 8.3* 8.5   PT/INR No results for input(s): LABPROT, INR in the last 72 hours. ABG No results for input(s): PHART, HCO3 in the last 72 hours.  Invalid input(s): PCO2, PO2  Studies/Results: No results found.  Anti-infectives: Anti-infectives    Start     Dose/Rate Route Frequency Ordered Stop   01/28/15 1800  fluconazole (DIFLUCAN) IVPB 100 mg     100 mg 50 mL/hr over 60 Minutes Intravenous  Once 01/28/15 1631 01/28/15 1948      Assessment/Plan:  1 - Post-op Ileus - Resolving. Advance to reg diet today. MLIVF.  2 - Bladder and Urethral Cancer - s/p surgery, considering possible adjuvant therapy in few mos penidng surgical recovery.   3 - Disposition / Rehab - Likley Dc to home with [facility] at discharge this admission.   4 - remain in house. Possibly home on Monday.   I have seen Mario Reese. I agree with the above noted. We will changes Protonix to by mouth and saline lock IV.   [name] 01/31/2015

## 2015-02-01 LAB — BASIC METABOLIC PANEL
Anion gap: 6 (ref 5–15)
BUN: 14 mg/dL (ref 6–23)
CO2: 29 mmol/L (ref 19–32)
CREATININE: 0.79 mg/dL (ref 0.50–1.35)
Calcium: 8.3 mg/dL — ABNORMAL LOW (ref 8.4–10.5)
Chloride: 104 mmol/L (ref 96–112)
GFR calc Af Amer: >90 mL/min (ref >90)
GFR calc non Af Amer: 90 mL/min — ABNORMAL LOW (ref >90)
GLUCOSE: 149 mg/dL — AB (ref 70–99)
Potassium: 3.7 mmol/L (ref 3.5–5.1)
Sodium: 139 mmol/L (ref 135–145)

## 2015-02-01 LAB — GLUCOSE, CAPILLARY
Glucose-Capillary: 114 mg/dL — ABNORMAL HIGH (ref 70–99)
Glucose-Capillary: 147 mg/dL — ABNORMAL HIGH (ref 70–99)

## 2015-02-01 NOTE — Discharge Summary (Signed)
Date of admission: 01/26/2015  Date of discharge: 02/01/2015  Admission diagnosis: ileus  Discharge diagnosis: same  Secondary diagnoses: bladder cancer, diabetes, obesity.  History and Physical: For full details, please see admission history and physical. Briefly, Mario Reese is a 69 y.o. year old patient with bladder cancer who underwent RARC/IC and urethrectomy on 1/30. His postop course was uncomplicated and he was discharged home on POD#7. Four days later he was admitted with nausea/vomiting/abdominal swelling and inability to tolerate PO. CT scan was consistent with ileus.  Hospital Course: An NGT was inserted, PICC line was placed, and he was started on TPN. Throughout the hospitalization, he had numerous liquidy bowel movements, which finally started to form by HD #6 (day of discharge). He passed NGT clamping trial, and the NGT was removed. His diet was advanced slowly until he was tolerating a reg diet. By POD#6, he had tolerated 4 solid meals without nausea/vomiting, his BMs were more formed, and he was discharged home. PICC line was removed. Bander stents are still in place.  Laboratory values: No results for input(s): HGB, HCT in the last 72 hours.  Recent Labs  01/30/15 0525  CREATININE 0.66    Disposition: Home  Discharge instruction: The patient was instructed to be ambulatory but told to refrain from heavy lifting, strenuous activity, or driving.  Discharge medications:    Medication List    TAKE these medications        amLODipine 10 MG tablet  Commonly known as:  NORVASC  Take 10 mg by mouth every morning.     aspirin EC 81 MG tablet  Take 81 mg by mouth every morning.     Fish Oil 1000 MG Caps  Take 2 capsules by mouth every morning.     glimepiride 2 MG tablet  Commonly known as:  AMARYL  Take 2 mg by mouth daily with breakfast.     ibuprofen 200 MG tablet  Commonly known as:  ADVIL,MOTRIN  Take 400 mg by mouth daily as needed for fever, headache,  moderate pain or cramping. Takes every morning     lisinopril-hydrochlorothiazide 20-12.5 MG per tablet  Commonly known as:  PRINZIDE,ZESTORETIC  Take 2 tablets by mouth daily with breakfast.     metformin 500 MG (OSM) 24 hr tablet  Commonly known as:  FORTAMET  Take 2,000 mg by mouth daily with breakfast.     metoprolol 200 MG 24 hr tablet  Commonly known as:  TOPROL-XL  Take 200 mg by mouth daily with breakfast.     multivitamin with minerals Tabs tablet  Take 1 tablet by mouth daily.     omeprazole 20 MG capsule  Commonly known as:  PRILOSEC  Take 20 mg by mouth every morning.     oxyCODONE-acetaminophen 5-325 MG per tablet  Commonly known as:  ROXICET  Take 1-2 tablets by mouth every 6 (six) hours as needed for moderate pain or severe pain. Post-operatively     pravastatin 40 MG tablet  Commonly known as:  PRAVACHOL  Take 40 mg by mouth every morning.     sennosides-docusate sodium 8.6-50 MG tablet  Commonly known as:  SENOKOT-S  Take 1 tablet by mouth 2 (two) times daily. As needed while taking pain meds to prevent constipation     sulfamethoxazole-trimethoprim 400-80 MG per tablet  Commonly known as:  BACTRIM  Take 1 tablet by mouth 2 (two) times daily. X 3 days to prevent infection     vitamin B-12 1000 MCG  tablet  Commonly known as:  CYANOCOBALAMIN  Take 1,000 mcg by mouth every morning.        Followup:   Call office to confirm timing of stent removal.

## 2015-02-01 NOTE — Progress Notes (Signed)
CARE MANAGEMENT NOTE 02/01/2015  Patient:  BARTON, WANT   Account Number:  [id number]  Date Initiated:  01/26/2015  Documentation initiated by:  Dessa Phi  Subjective/Objective Assessment:   69 y/o m admitted w/Ileus.Readmit-1/28-01/22/15-Bladder Ca, cystoprostatectomy-ileal conduit,ileostomy.     Action/Plan:   From home.   Anticipated DC Date:  01/30/2015   Anticipated DC Plan:  [place]  CM consult      [facility] Choice  Resumption Of Svcs/PTA Provider   Choice offered to / List presented to:          [facility] arranged  HH-1 RN      [place].   Status of service:  Completed, signed off Medicare Important Message given?  YES (If response is "NO", the following Medicare IM given date fields will be blank) Date Medicare IM given:  01/29/2015 Medicare IM given by:  [facility] Date Additional Medicare IM given:   Additional Medicare IM given by:    Discharge Disposition:  [place]  Per UR Regulation:  Reviewed for med. necessity/level of care/duration of stay  If discussed at [place] of Stay Meetings, dates discussed:    Comments:  02/01/2015 1600 [facility] aware of scheduled dc home today with HH. Jonnie Finner RN CCM Case Mgmt phone [phone]   01/29/15 Dessa Phi RN BSN NCM [phone] Await PT recommendations.[facility] following if [facility] needed & ordered.  01/27/15 Dessa Phi RN BSN NCM [phone] [facility] was ordered from prior admission but did not complete home visit before readmission.Patient agree to allow [facility] for [facility] if recommended.TC [facility] Kristen rep aware of referral.Await PT recommendations, & orders.  01/26/15 Dessa Phi RN BSN NCM [phone] NGT, TPN, IVF, Wound care.Monitor progress for d/c needs.PT cons will await recommendations.

## 2015-02-08 ENCOUNTER — Emergency Department (HOSPITAL_COMMUNITY): Payer: Medicare Other

## 2015-02-08 ENCOUNTER — Emergency Department (HOSPITAL_COMMUNITY)
Admission: EM | Admit: 2015-02-08 | Discharge: 2015-02-08 | Disposition: A | Discharge status: Home / Self Care | Payer: Medicare Other | Attending: Emergency Medicine | Admitting: Emergency Medicine

## 2015-02-08 ENCOUNTER — Encounter (HOSPITAL_COMMUNITY): Payer: Self-pay

## 2015-02-08 DIAGNOSIS — K219 Gastro-esophageal reflux disease without esophagitis: Secondary | ICD-10-CM | POA: Diagnosis not present

## 2015-02-08 DIAGNOSIS — E119 Type 2 diabetes mellitus without complications: Secondary | ICD-10-CM | POA: Insufficient documentation

## 2015-02-08 DIAGNOSIS — Z8551 Personal history of malignant neoplasm of bladder: Secondary | ICD-10-CM | POA: Insufficient documentation

## 2015-02-08 DIAGNOSIS — Z792 Long term (current) use of antibiotics: Secondary | ICD-10-CM | POA: Insufficient documentation

## 2015-02-08 DIAGNOSIS — I10 Essential (primary) hypertension: Secondary | ICD-10-CM | POA: Diagnosis not present

## 2015-02-08 DIAGNOSIS — Z79899 Other long term (current) drug therapy: Secondary | ICD-10-CM | POA: Insufficient documentation

## 2015-02-08 DIAGNOSIS — N390 Urinary tract infection, site not specified: Secondary | ICD-10-CM | POA: Insufficient documentation

## 2015-02-08 DIAGNOSIS — R509 Fever, unspecified: Secondary | ICD-10-CM | POA: Diagnosis present

## 2015-02-08 DIAGNOSIS — E785 Hyperlipidemia, unspecified: Secondary | ICD-10-CM | POA: Insufficient documentation

## 2015-02-08 DIAGNOSIS — Z7982 Long term (current) use of aspirin: Secondary | ICD-10-CM | POA: Insufficient documentation

## 2015-02-08 DIAGNOSIS — Z8559 Personal history of malignant neoplasm of other urinary tract organ: Secondary | ICD-10-CM | POA: Diagnosis not present

## 2015-02-08 DIAGNOSIS — Z87891 Personal history of nicotine dependence: Secondary | ICD-10-CM | POA: Diagnosis not present

## 2015-02-08 LAB — CBC WITH DIFFERENTIAL/PLATELET
BASOS PCT: 0 % (ref 0–1)
Basophils Absolute: 0 10*3/uL (ref 0.0–0.1)
Eosinophils Absolute: 0 10*3/uL (ref 0.0–0.7)
Eosinophils Relative: 0 % (ref 0–5)
HCT: 33.1 % — ABNORMAL LOW (ref 39.0–52.0)
HEMOGLOBIN: 11.3 g/dL — AB (ref 13.0–17.0)
Lymphocytes Relative: 8 % — ABNORMAL LOW (ref 12–46)
Lymphs Abs: 0.8 10*3/uL (ref 0.7–4.0)
MCH: 32.5 pg (ref 26.0–34.0)
MCHC: 34.1 g/dL (ref 30.0–36.0)
MCV: 95.1 fL (ref 78.0–100.0)
Monocytes Absolute: 1.1 10*3/uL — ABNORMAL HIGH (ref 0.1–1.0)
Monocytes Relative: 11 % (ref 3–12)
Neutro Abs: 8.1 10*3/uL — ABNORMAL HIGH (ref 1.7–7.7)
Neutrophils Relative %: 81 % — ABNORMAL HIGH (ref 43–77)
PLATELETS: 209 10*3/uL (ref 150–400)
RBC: 3.48 MIL/uL — AB (ref 4.22–5.81)
RDW: 12.9 % (ref 11.5–15.5)
WBC: 10.1 10*3/uL (ref 4.0–10.5)

## 2015-02-08 LAB — COMPREHENSIVE METABOLIC PANEL
ALT: 47 U/L (ref 0–53)
AST: 49 U/L — AB (ref 0–37)
Albumin: 2.5 g/dL — ABNORMAL LOW (ref 3.5–5.2)
Alkaline Phosphatase: 92 U/L (ref 39–117)
Anion gap: 12 (ref 5–15)
BUN: 26 mg/dL — ABNORMAL HIGH (ref 6–23)
CO2: 25 mmol/L (ref 19–32)
CREATININE: 1.29 mg/dL (ref 0.50–1.35)
Calcium: 7.8 mg/dL — ABNORMAL LOW (ref 8.4–10.5)
Chloride: 98 mmol/L (ref 96–112)
GFR calc Af Amer: 64 mL/min — ABNORMAL LOW (ref >90)
GFR, EST NON AFRICAN AMERICAN: 55 mL/min — AB (ref >90)
Glucose, Bld: 161 mg/dL — ABNORMAL HIGH (ref 70–99)
POTASSIUM: 3.3 mmol/L — AB (ref 3.5–5.1)
SODIUM: 135 mmol/L (ref 135–145)
TOTAL PROTEIN: 6.9 g/dL (ref 6.0–8.3)
Total Bilirubin: 1 mg/dL (ref 0.3–1.2)

## 2015-02-08 LAB — URINALYSIS, ROUTINE W REFLEX MICROSCOPIC
Bilirubin Urine: NEGATIVE
GLUCOSE, UA: NEGATIVE mg/dL
Ketones, ur: NEGATIVE mg/dL
NITRITE: NEGATIVE
Protein, ur: 100 mg/dL — AB
SPECIFIC GRAVITY, URINE: 1.019 (ref 1.005–1.030)
Urobilinogen, UA: 1 mg/dL (ref 0.0–1.0)
pH: 8.5 — ABNORMAL HIGH (ref 5.0–8.0)

## 2015-02-08 LAB — URINE MICROSCOPIC-ADD ON

## 2015-02-08 LAB — LIPASE, BLOOD: LIPASE: 51 U/L (ref 11–59)

## 2015-02-08 MED ORDER — SODIUM CHLORIDE 0.9 % IV SOLN
Freq: Once | INTRAVENOUS | Status: AC
Start: 2015-02-08 — End: 2015-02-08
  Administered 2015-02-08: 08:00:00 via INTRAVENOUS

## 2015-02-08 MED ORDER — ACETAMINOPHEN 325 MG PO TABS
650.0000 mg | ORAL_TABLET | Freq: Once | ORAL | Status: AC
  Administered 2015-02-08: 650 mg via ORAL
  Filled 2015-02-08: qty 2

## 2015-02-08 MED ORDER — IOHEXOL 300 MG/ML  SOLN
50.0000 mL | Freq: Once | INTRAMUSCULAR | Status: AC | PRN
  Administered 2015-02-08: 50 mL via ORAL

## 2015-02-08 MED ORDER — IOHEXOL 300 MG/ML  SOLN
100.0000 mL | Freq: Once | INTRAMUSCULAR | Status: AC | PRN
  Administered 2015-02-08: 100 mL via INTRAVENOUS

## 2015-02-08 MED ORDER — CEPHALEXIN 500 MG PO CAPS: 500.0000 mg | ORAL_CAPSULE | Freq: Two times a day (BID) | ORAL | Status: AC

## 2015-02-08 MED ORDER — DEXTROSE 5 % IV SOLN
1.0000 g | Freq: Once | INTRAVENOUS | Status: AC
  Administered 2015-02-08: 1 g via INTRAVENOUS
  Filled 2015-02-08: qty 10

## 2015-02-08 NOTE — ED Provider Notes (Signed)
CSN: [id number]     Arrival date & time 02/08/15  0258 History   First MD Initiated Contact with Patient 02/08/15 0325     Chief Complaint  Patient presents with  . Fever     (Consider location/radiation/quality/duration/timing/severity/associated sxs/prior Treatment) HPI Comments: Patient is a 69 year old male with past medical history of bladder and urethral cancer, type 2 diabetes, hypertension, and hyperlipidemia who presents with a fever and chills that started 2 days ago. Symptoms started gradually and progressively worsened since the onset. Patient has been having fevers of 102.2 at home for the past 2 nights. Patient has ileoconduit surgery on January 29 and was discharged 4 days later. He returned to the hospital on February 8 for an ileus. Patient has no other symptoms at this time. Patient has been treating fever with tylenol at home which has provided some relief. No aggravating factors. No other associated symptoms.    Past Medical History  Diagnosis Date  . Hypertension   . Hyperlipidemia   . Diabetes mellitus, type 2   . GERD (gastroesophageal reflux disease)   . Frequency of urination   . Urgency of urination   . Nocturia   . Bladder cancer   . Multiple pulmonary nodules BILATERAL -- NOT METS PER PET SCAN   Past Surgical History  Procedure Laterality Date  . Transurethral resection of bladder tumor  10/24/2012    Procedure: TRANSURETHRAL RESECTION OF BLADDER TUMOR (TURBT);  Surgeon: Alexis Frock, MD;  Location: [facility];  Service: Urology;  Laterality: N/A;  ATHY   . Cystoscopy with ureteroscopy  10/24/2012    Procedure: CYSTOSCOPY WITH URETEROSCOPY;  Surgeon: Alexis Frock, MD;  Location: [facility];  Service: Urology;  Laterality: Bilateral;  retrograde and pyelogram  . Transurethral resection of bladder tumor N/A 04/24/2013    Procedure: TRANSURETHRAL RESECTION OF BLADDER TUMOR (TURBT);  Surgeon: Alexis Frock, MD;  Location:  [facility];  Service: Urology;  Laterality: N/A;  . Cystoscopy w/ retrogrades Bilateral 04/24/2013    Procedure: CYSTOSCOPY WITH  Bilateral RETROGRADE PYELOGRAM;  Surgeon: Alexis Frock, MD;  Location: [facility];  Service: Urology;  Laterality: Bilateral;  . Tonsillectomy    . Cystoscopy with biopsy N/A 12/02/2014    Procedure: CYSTOSCOPY WITH BIOPSY / Almyra Free;  Surgeon: Junious Dresser, MD;  Location: [facility];  Service: Urology;  Laterality: N/A;  . Cystoscopy w/ retrogrades Bilateral 12/02/2014    Procedure: CYSTOSCOPY WITH RETROGRADE PYELOGRAM AND URETHRAL DILATION;  Surgeon: Junious Dresser, MD;  Location: [facility];  Service: Urology;  Laterality: Bilateral;  . Robot assisted laparoscopic complete cystect ileal conduit N/A 01/16/2015    Procedure: ROBOTIC ASSISTED CYSTOPROSTATECTOMY AND URETHRECTOMY  WITH CONDUIT ;  Surgeon: Alexis Frock, MD;  Location: [facility];  Service: Urology;  Laterality: N/A;  . Cystoscopy N/A 01/16/2015    Procedure: CYSTOSCOPY INDOCYANINE GREEN DYE,;  Surgeon: Alexis Frock, MD;  Location: [facility];  Service: Urology;  Laterality: N/A;  . Ileo conduit     No family history on file. History  Substance Use Topics  . Smoking status: Former Smoker -- 1.00 packs/day for 30 years    Types: Cigarettes    Quit date: 10/23/2011  . Smokeless tobacco: Never Used     Comment: 12-01-14 now smoking again - 1ppd "nerves"  . Alcohol Use: 21.6 oz/week    36 Cans of beer per week     Comment: 6 PK BEER DAILY  Review of Systems  Constitutional: Positive for fever. Negative for chills and fatigue.  HENT: Negative for trouble swallowing.   Eyes: Negative for visual disturbance.  Respiratory: Negative for shortness of breath.   Cardiovascular: Negative for chest pain and palpitations.  Gastrointestinal: Negative for nausea, vomiting, abdominal pain and diarrhea.  Genitourinary: Negative for dysuria and difficulty urinating.   Musculoskeletal: Negative for arthralgias and neck pain.  Skin: Negative for color change.  Neurological: Negative for dizziness and weakness.  Psychiatric/Behavioral: Negative for dysphoric mood.      Allergies  Review of patient's allergies indicates no known allergies.  Home Medications   Prior to Admission medications   Medication Sig Start Date End Date Taking? Authorizing Provider  amLODipine (NORVASC) 10 MG tablet Take 10 mg by mouth every morning.    Historical Provider, MD  aspirin EC 81 MG tablet Take 81 mg by mouth every morning.    Historical Provider, MD  glimepiride (AMARYL) 2 MG tablet Take 2 mg by mouth daily with breakfast.    Historical Provider, MD  ibuprofen (ADVIL,MOTRIN) 200 MG tablet Take 400 mg by mouth daily as needed for fever, headache, moderate pain or cramping. Takes every morning    Historical Provider, MD  lisinopril-hydrochlorothiazide (PRINZIDE,ZESTORETIC) 20-12.5 MG per tablet Take 2 tablets by mouth daily with breakfast.    Historical Provider, MD  metformin (FORTAMET) 500 MG (OSM) 24 hr tablet Take 2,000 mg by mouth daily with breakfast.    Historical Provider, MD  metoprolol (TOPROL-XL) 200 MG 24 hr tablet Take 200 mg by mouth daily with breakfast.    Historical Provider, MD  Multiple Vitamin (MULTIVITAMIN WITH MINERALS) TABS tablet Take 1 tablet by mouth daily.    Historical Provider, MD  Omega-3 Fatty Acids (FISH OIL) 1000 MG CAPS Take 2 capsules by mouth every morning.    Historical Provider, MD  omeprazole (PRILOSEC) 20 MG capsule Take 20 mg by mouth every morning.    Historical Provider, MD  oxyCODONE-acetaminophen (ROXICET) 5-325 MG per tablet Take 1-2 tablets by mouth every 6 (six) hours as needed for moderate pain or severe pain. Post-operatively 01/22/15   Alexis Frock, MD  pravastatin (PRAVACHOL) 40 MG tablet Take 40 mg by mouth every morning.    Historical Provider, MD  sennosides-docusate sodium (SENOKOT-S) 8.6-50 MG tablet Take 1 tablet  by mouth 2 (two) times daily. As needed while taking pain meds to prevent constipation 01/22/15   Alexis Frock, MD  sulfamethoxazole-trimethoprim (BACTRIM) 400-80 MG per tablet Take 1 tablet by mouth 2 (two) times daily. X 3 days to prevent infection Patient not taking: Reported on 11/27/2014 04/24/13   Alexis Frock, MD  vitamin B-12 (CYANOCOBALAMIN) 1000 MCG tablet Take 1,000 mcg by mouth every morning.    Historical Provider, MD   BP 119/59 mmHg  Pulse 95  Temp(Src) 101 F (38.3 C) (Oral)  Resp 22  SpO2 94% Physical Exam  Constitutional: He is oriented to person, place, and time. He appears well-developed and well-nourished. No distress.  HENT:  Head: Normocephalic and atraumatic.  Eyes: Conjunctivae and EOM are normal.  Neck: Normal range of motion.  Cardiovascular: Normal rate and regular rhythm.  Exam reveals no gallop and no friction rub.   No murmur heard. Pulmonary/Chest: Effort normal and breath sounds normal. He has no wheezes. He has no rales. He exhibits no tenderness.  Abdominal: Soft. He exhibits no distension. There is no tenderness. There is no rebound.  Well healed vertical incision of the lower umbilical area without  signs of infection. Ileostomy noted at the RLQ. No abdominal tenderness to palpation. Lower abdominal erythema and warmth at the patient's belt line abdominal area.   Musculoskeletal: Normal range of motion.  Neurological: He is alert and oriented to person, place, and time. Coordination normal.  Speech is goal-oriented. Moves limbs without ataxia.   Skin: Skin is warm and dry.  Erythema and warmth of lower abdomen. No open wound.    Psychiatric: He has a normal mood and affect. His behavior is normal.  Nursing note and vitals reviewed.   ED Course  Procedures (including critical care time) Labs Review Labs Reviewed  CBC WITH DIFFERENTIAL/PLATELET - Abnormal; Notable for the following:    RBC 3.48 (*)    Hemoglobin 11.3 (*)    HCT 33.1 (*)     Neutrophils Relative % 81 (*)    Neutro Abs 8.1 (*)    Lymphocytes Relative 8 (*)    Monocytes Absolute 1.1 (*)    All other components within normal limits  COMPREHENSIVE METABOLIC PANEL - Abnormal; Notable for the following:    Potassium 3.3 (*)    Glucose, Bld 161 (*)    BUN 26 (*)    Calcium 7.8 (*)    Albumin 2.5 (*)    AST 49 (*)    GFR calc non Af Amer 55 (*)    GFR calc Af Amer 64 (*)    All other components within normal limits  URINALYSIS, ROUTINE W REFLEX MICROSCOPIC - Abnormal; Notable for the following:    APPearance TURBID (*)    pH 8.5 (*)    Hgb urine dipstick MODERATE (*)    Protein, ur 100 (*)    Leukocytes, UA LARGE (*)    All other components within normal limits  URINE MICROSCOPIC-ADD ON - Abnormal; Notable for the following:    Bacteria, UA MANY (*)    Casts HYALINE CASTS (*)    All other components within normal limits  CULTURE, BLOOD (ROUTINE X 2)  CULTURE, BLOOD (ROUTINE X 2)  URINE CULTURE  LIPASE, BLOOD    Imaging Review Ct Abdomen Pelvis W Contrast  02/08/2015   CLINICAL DATA:  Low-grade fever and chills for 2 days. Ileostomy 01/17/2015  EXAM: CT ABDOMEN AND PELVIS WITH CONTRAST  TECHNIQUE: Multidetector CT imaging of the abdomen and pelvis was performed using the standard protocol following bolus administration of intravenous contrast.  CONTRAST:  157mL OMNIPAQUE IOHEXOL 300 MG/ML  SOLN  COMPARISON:  12/22/2014  FINDINGS: There has been a cystectomy with right lower quadrant urostomy. There are ureteral stents bilaterally, extending out into the urostomy. There is mild hydronephrosis on the left. No renal parenchymal abnormalities are evident. There are unremarkable appearances of the liver, spleen, pancreas and adrenals. There is moderate gallbladder distention. There are a few calculi within the gallbladder lumen. There is no bile duct dilatation. The stomach, small bowel and colon are unremarkable in appearance. There is no bowel obstruction. There is  no free intraperitoneal air. There is a small volume ascites. Unchanged nodules in the lung bases again noted.  IMPRESSION: Cystectomy with right lower quadrant urostomy and bilateral ureteral stents.  Mild left hydronephrosis.  Small volume ascites  Moderate gallbladder distention.  Cholelithiasis.   Electronically Signed   By: Andreas Newport M.D.   On: 02/08/2015 06:14     EKG Interpretation None      MDM   Final diagnoses:  UTI (lower urinary tract infection)    4:00 AM Labs, urinalysis, and  CT abdomen/pelvis pending. Patient is febrile at 101 with remaining vitals stable.   Patient signed out to Noland Fordyce, [place], PA-C 02/09/15 [id number]  Julianne Rice, MD 02/09/15 [phone]

## 2015-02-08 NOTE — ED Notes (Signed)
Pt escorted to discharge window. Pt verbalized understanding discharge instructions. In no acute distress.  

## 2015-02-08 NOTE — ED Notes (Signed)
Patient reports that he's had chills x 2 days.  Family reports he has had a low-grade fever for the past two nights, spiking to 102.2 at home PTA.  Untreated.  Recent ileostomy.  Family reports loose stools.

## 2015-02-08 NOTE — ED Provider Notes (Signed)
6:05 AM Pt signed out at shift change by Alvina Chou, PA-C.  Pt is currently in radiology for CT abdomen.  Pt presented to ED with 2 day hx of fever and chills.  Tmax 102.2 at home.  Recent ileostomy.  Pt was seen on 01/26/15, admitted for ileus. Discharged on 02/01/15.  Pt denies pain today, but is concerned for infection due to fever.  UA c/w UTI.  If CT scan unremarkable, pt is to be treated for UTI and may be discharged home to f/u with urology.   6:21 AM CT significant for cystectomy with Right lower quadrant urostomy and bilateral ureteral stents. Mild left hydronephrosis. Small volume ascites.  Moderate gallbladder distention. Cholelithiasis.  6:25 AM pt still denies pain. No nausea or vomiting.  Abd: soft, non-tender.    6:32 AM Consulted with Dr. Risa Grill, urology.  Mild hydronephrosis is likely due to pt's recent surgery.  Urine culture in process.  WBC-10.1.  Pt is non-toxic appearing. NAD.  Pt is safe for discharge home. Will tx UTI in ED with 1g IV rocephin and discharge home on keflex for 7 days as fluoroquinolones interact with pt's diabetes medications.    Home care instructions provided. Advised to f/u with urology or PCP in 3-4 days for symptoms recheck if not improving. Return precautions provided. Pt verbalized understanding and agreement with tx plan.  7:55 AM Upon recheck of vitals prior to discharge, Temp back to 101. Will give acetaminophen prior to discharge.            Noland Fordyce, PA-C 02/08/15 0034  Julianne Rice, MD 02/09/15 [phone]

## 2015-02-08 NOTE — ED Notes (Signed)
Pt alert and oriented x4. Respirations even and unlabored, bilateral symmetrical rise and fall of chest. Skin warm and dry. In no acute distress. Denies needs.   

## 2015-02-08 NOTE — ED Notes (Signed)
Patient transported to CT 

## 2015-02-09 LAB — URINE CULTURE
Colony Count: >=100000
Special Requests: NORMAL

## 2015-02-14 LAB — CULTURE, BLOOD (ROUTINE X 2)
Culture: NO GROWTH
Culture: NO GROWTH

## 2015-03-18 IMAGING — CT CT ABD-PELV W/ CM
1 of 3 series · 12 of 32 positions shown, 17 images · IV contrast (100 ML OMNI 300)
Comparison: CT of the abdomen and pelvis from 12/22/2014

CLINICAL DATA: Intermittent generalized abdominal pain and
abdominal distention. Mild leukocytosis. Nausea and vomiting. Recent
ileal conduit surgery. Initial encounter.

EXAM:
CT ABDOMEN AND PELVIS WITH CONTRAST
TECHNIQUE: Multidetector CT imaging of the abdomen and pelvis was performed
using the standard protocol following bolus administration of
intravenous contrast.
CONTRAST:  100mL OMNIPAQUE IOHEXOL 300 MG/ML  SOLN

[Series 2: abd/pel with · axial · 0.95mm/px · z∈[+999,+1429]mm · 12 of 100 slices shown, 17 images]
[im 7/100  soft-tissue]
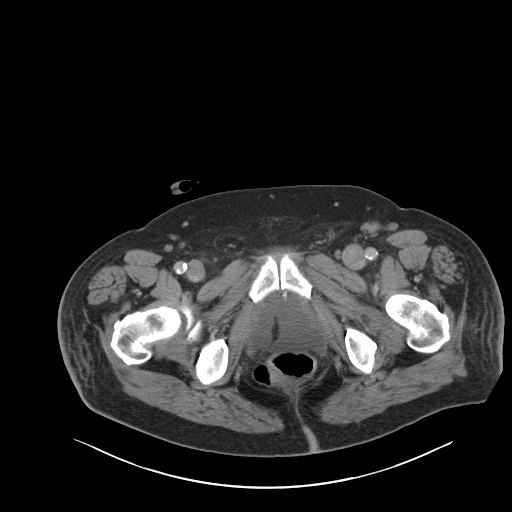
[im 7/100  bone]
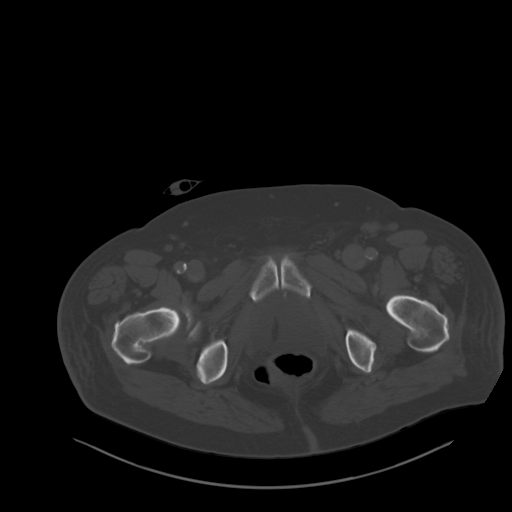
[im 19/100  soft-tissue]
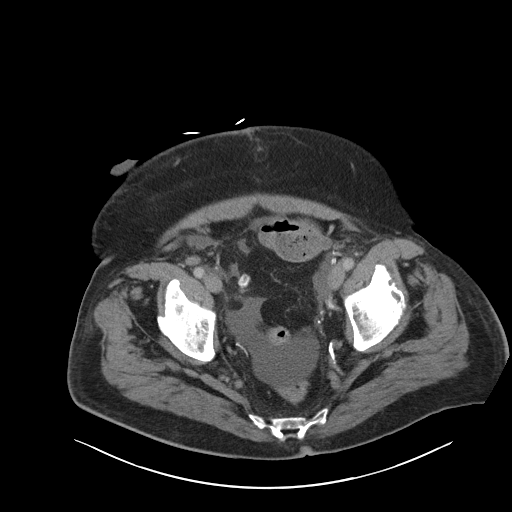
[im 25/100  soft-tissue]
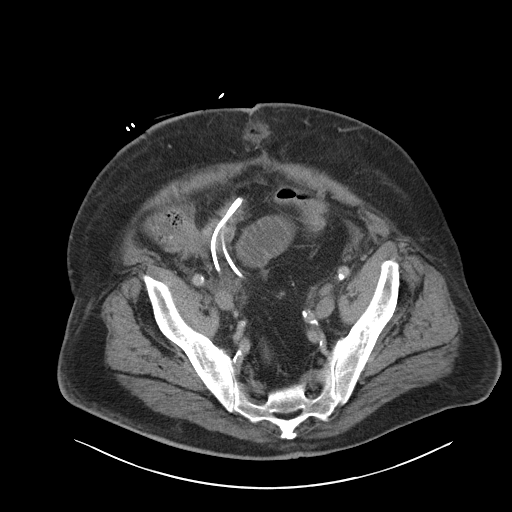
[im 31/100  soft-tissue]
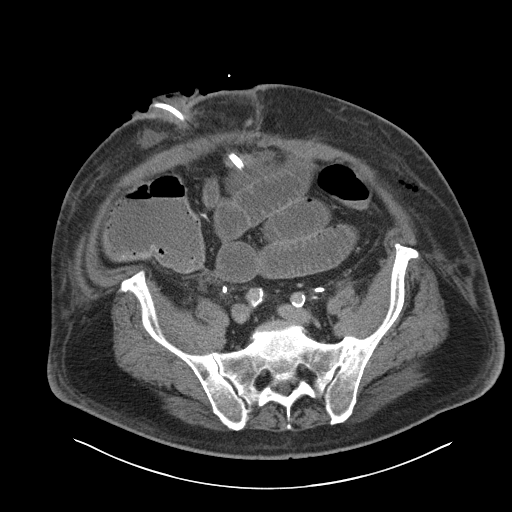
[im 44/100  soft-tissue]
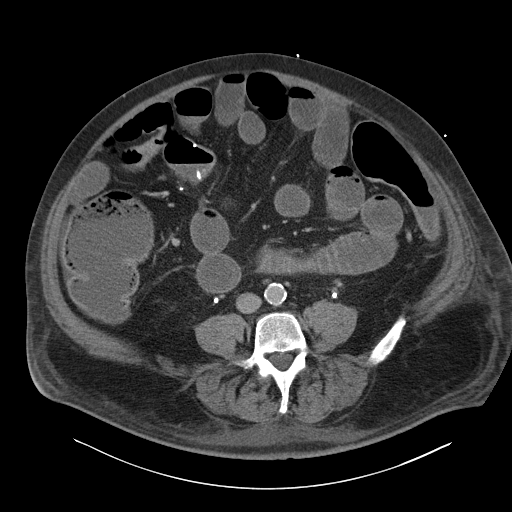
[im 50/100  soft-tissue]
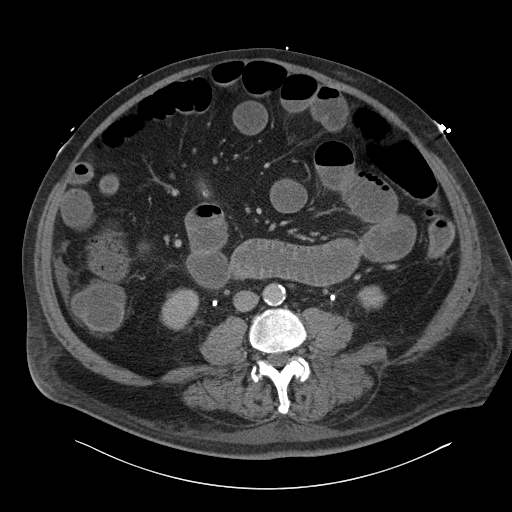
[im 56/100  soft-tissue]
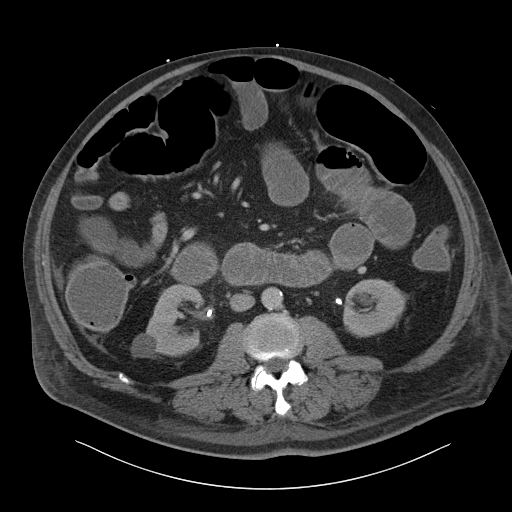
[im 69/100  soft-tissue]
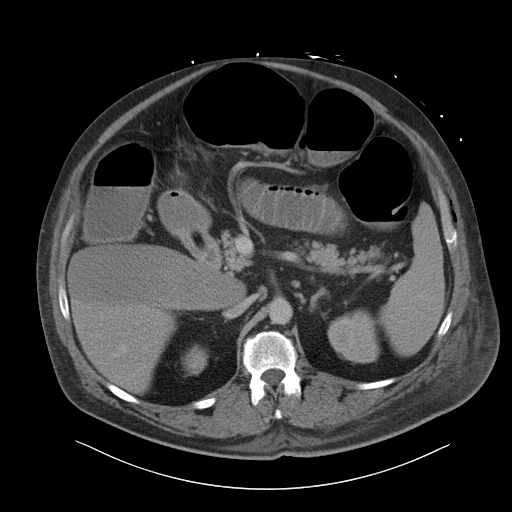
[im 75/100  soft-tissue]
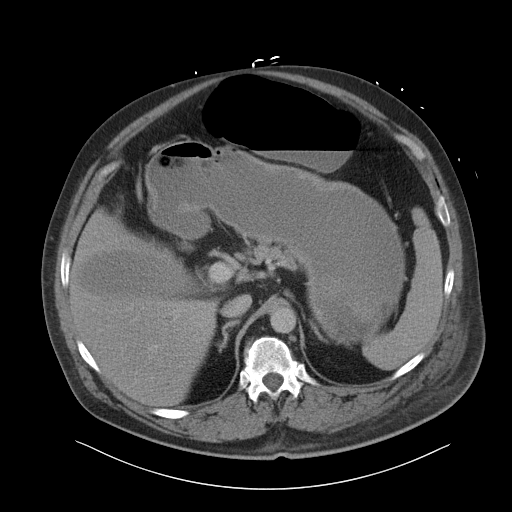
[im 75/100  lung]
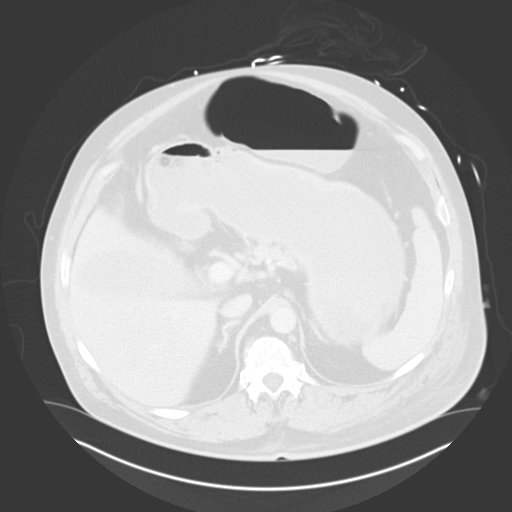
[im 75/100  bone]
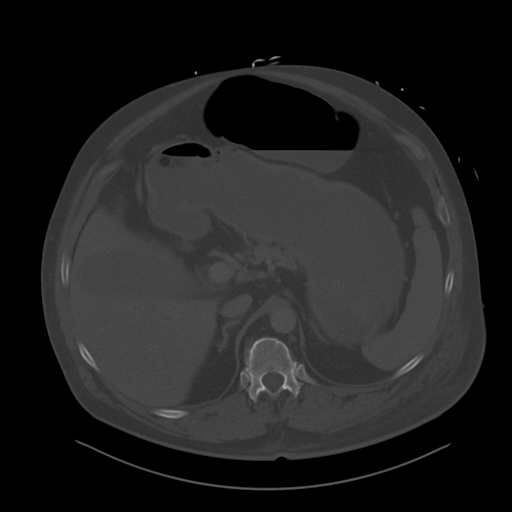
[im 81/100  soft-tissue]
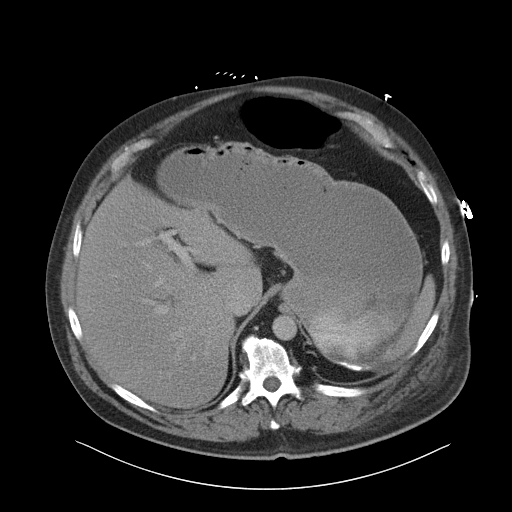
[im 81/100  lung]
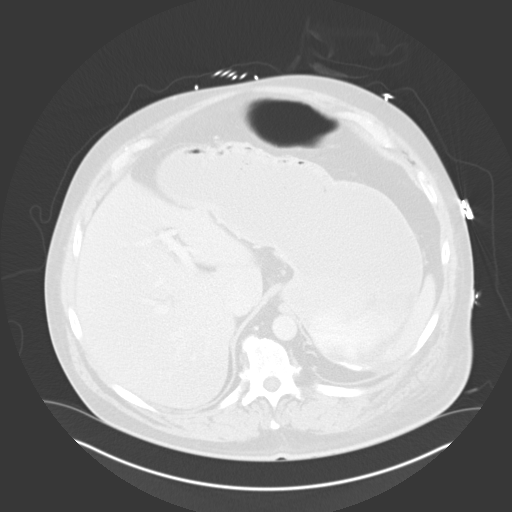
[im 87/100  lung]
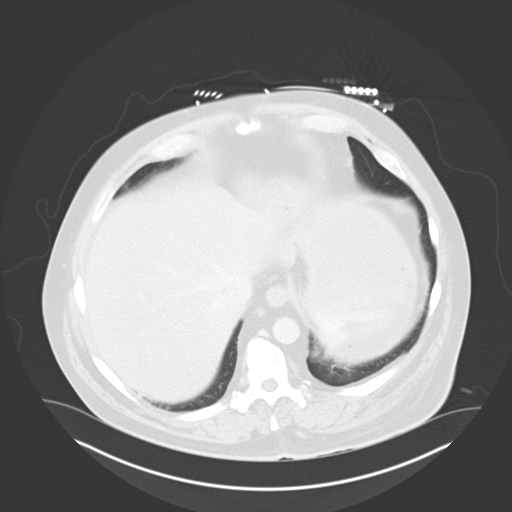
[im 93/100  soft-tissue]
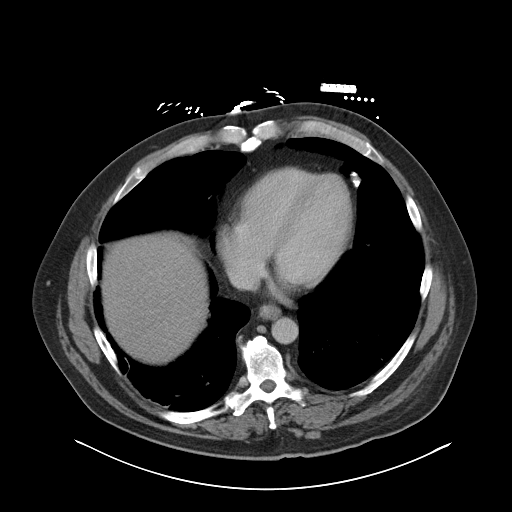
[im 93/100  lung]
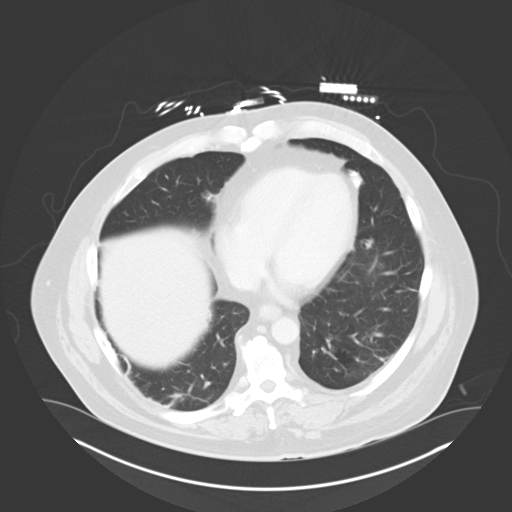

[12 of 32 positions shown; findings below may reference images not displayed]

FINDINGS: Mild focal right basilar atelectasis is noted. Scattered residual
partially calcified nodules are again noted at the lung bases,
stable or improved from 1621 and possibly postinfectious in nature.

There is diffuse distention of small bowel loops throughout the
abdomen, measuring up to 4.4 cm in diameter. However, no focal
transition point is seen. There is gradual decompression at the
right mid abdomen, with mild distention of a small bowel anastomosis
at the right mid abdomen. The colon is also largely filled with
fluid. Findings raise concern for small bowel dysmotility and mild
ileus.

The patient's ileal conduit is decompressed and not well assessed. A
set of ureteral stents is noted ending at the renal pelves
bilaterally, as expected. There is no evidence of hydronephrosis.
Nonspecific perinephric stranding and fluid is noted bilaterally. No
renal or ureteral stones are identified. A 2.7 cm right renal cyst
is noted.

Trace free fluid is noted about the liver and spleen, tracking along
the paracolic gutters into the pelvis.

Minimal soft tissue air along the anterior abdominal wall likely
reflects recent surgery.

There is mild prominence of the intrahepatic biliary ducts; the
liver is otherwise grossly unremarkable. The common bile duct
remains normal in size. The gallbladder is distended but otherwise
grossly unremarkable, grossly unchanged from prior studies dating
back to 1621. The pancreas and adrenal glands are unremarkable.

The stomach is largely filled with fluid and is within normal
limits. No acute vascular abnormalities are seen. Mild scattered
calcification is seen along the abdominal aorta and its branches.

The appendix is not characterized. The bladder has been resected.
Minimal soft tissue air is noted in tracking into the left inguinal
canal, likely also postoperative in nature. No inguinal
lymphadenopathy is seen.

No acute osseous abnormalities are identified. Facet disease is
noted at the lower lumbar spine.
IMPRESSION: 1. Diffuse distention of small bowel loops throughout the abdomen,
measuring up to 4.4 cm in diameter. No focal transition point seen.
Lateral decompression of small bowel at the right mid abdomen, with
mild distention of a small bowel anastomosis. The colon is largely
filled with fluid. Findings are concerning for small bowel
dysmotility and mild ileus.
2. Ileal conduit decompressed and not well assessed. Bilateral
ureteral stents are grossly unremarkable in appearance. No evidence
of hydronephrosis.
3. Gallbladder distended, as on prior studies, but otherwise grossly
unremarkable. Mild prominence of the intrahepatic biliary ducts
again noted. The common bile duct remains normal in caliber.
4. Trace free fluid noted within the abdomen and pelvis.
5. Minimal soft tissue air along the anterior abdominal wall and
tracking into the left inguinal canal is likely postoperative in
nature.
6. Mild focal right basilar atelectasis noted. Scattered residual
partially calcified pulmonary nodules are stable or improved from
1621, likely postinfectious in nature.
7. Small right renal cyst seen.
8. Mild scattered calcification along the abdominal aorta and its
branches.

## 2015-03-19 IMAGING — CR DG ABDOMEN 1V
1 series · 2 of 2 positions shown · non-contrast
Comparison: Abdominal series of January 26, 2015

CLINICAL DATA: Abdominal distension, discomfort, and loose stools,
recent onset. ; history of bladder and urethral malignancy with
subsequent urinary diversion.

EXAM:
ABDOMEN - 1 VIEW

[Series 1: ap (kub) · U · 2 of 2 slices shown]
[im 1/2]
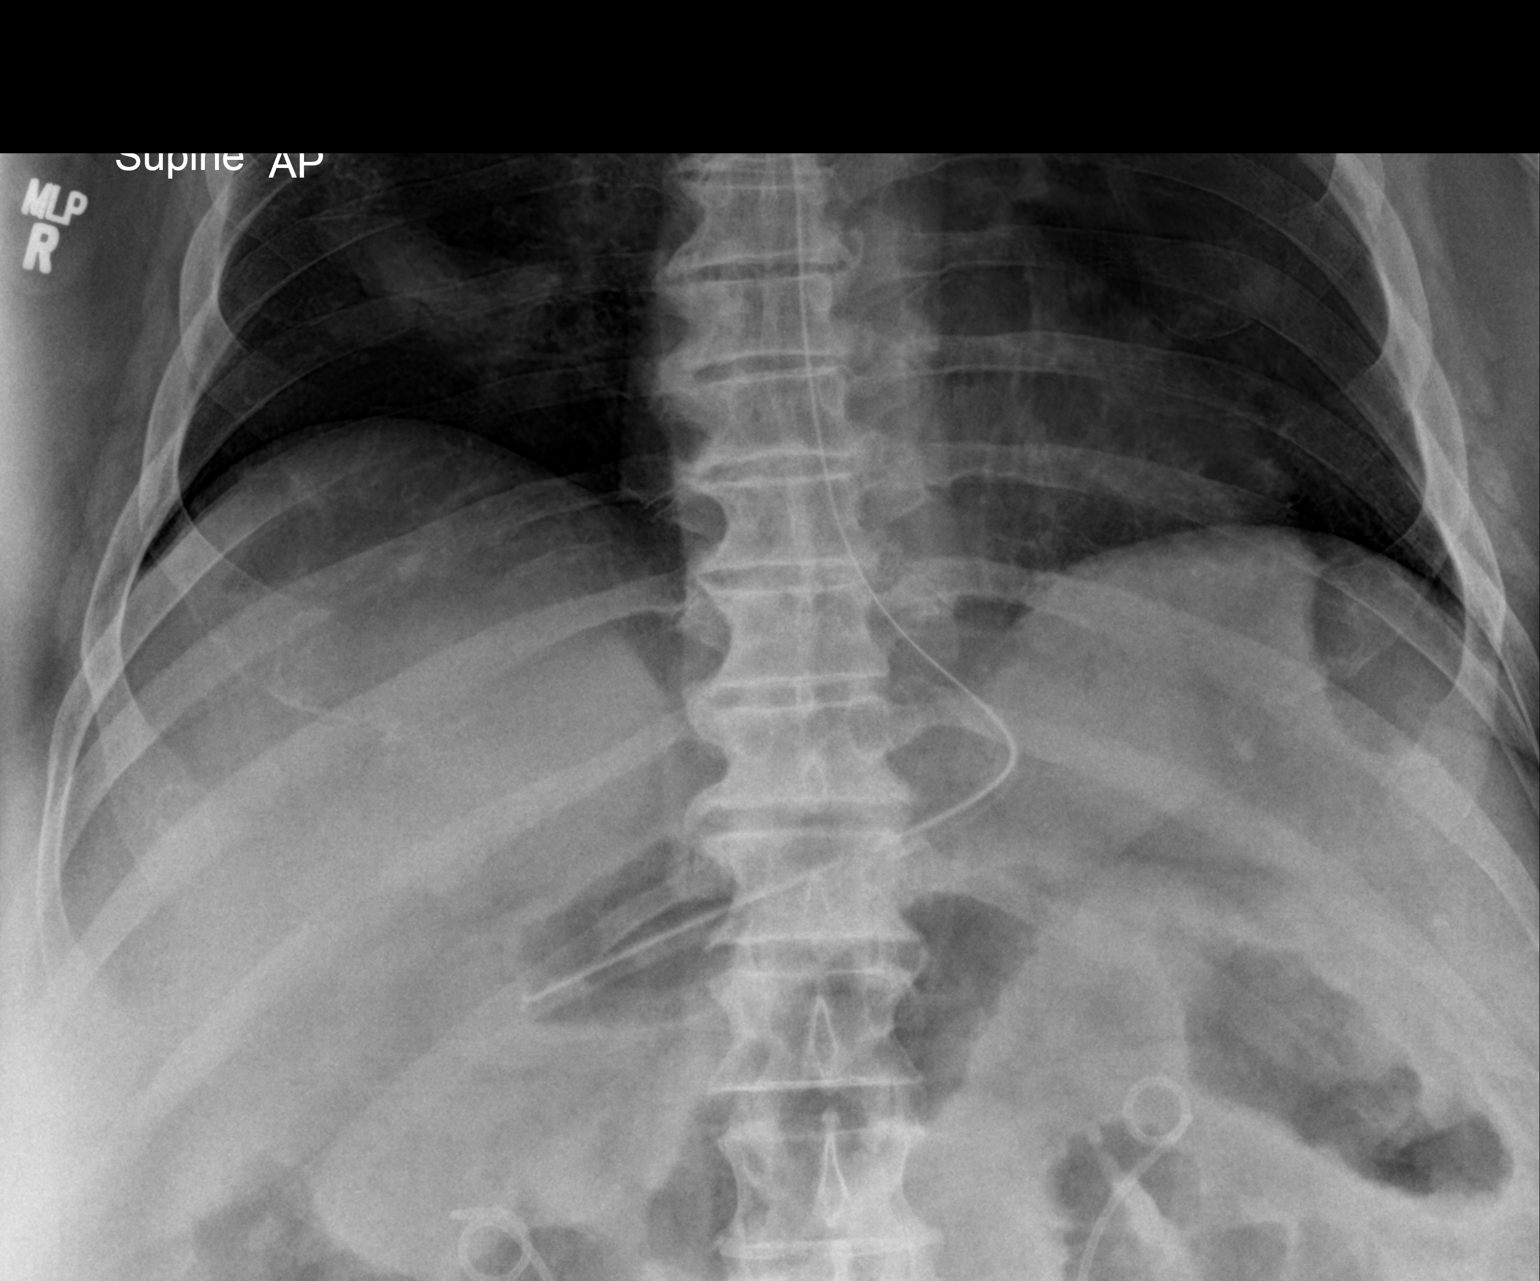
[im 2/2]
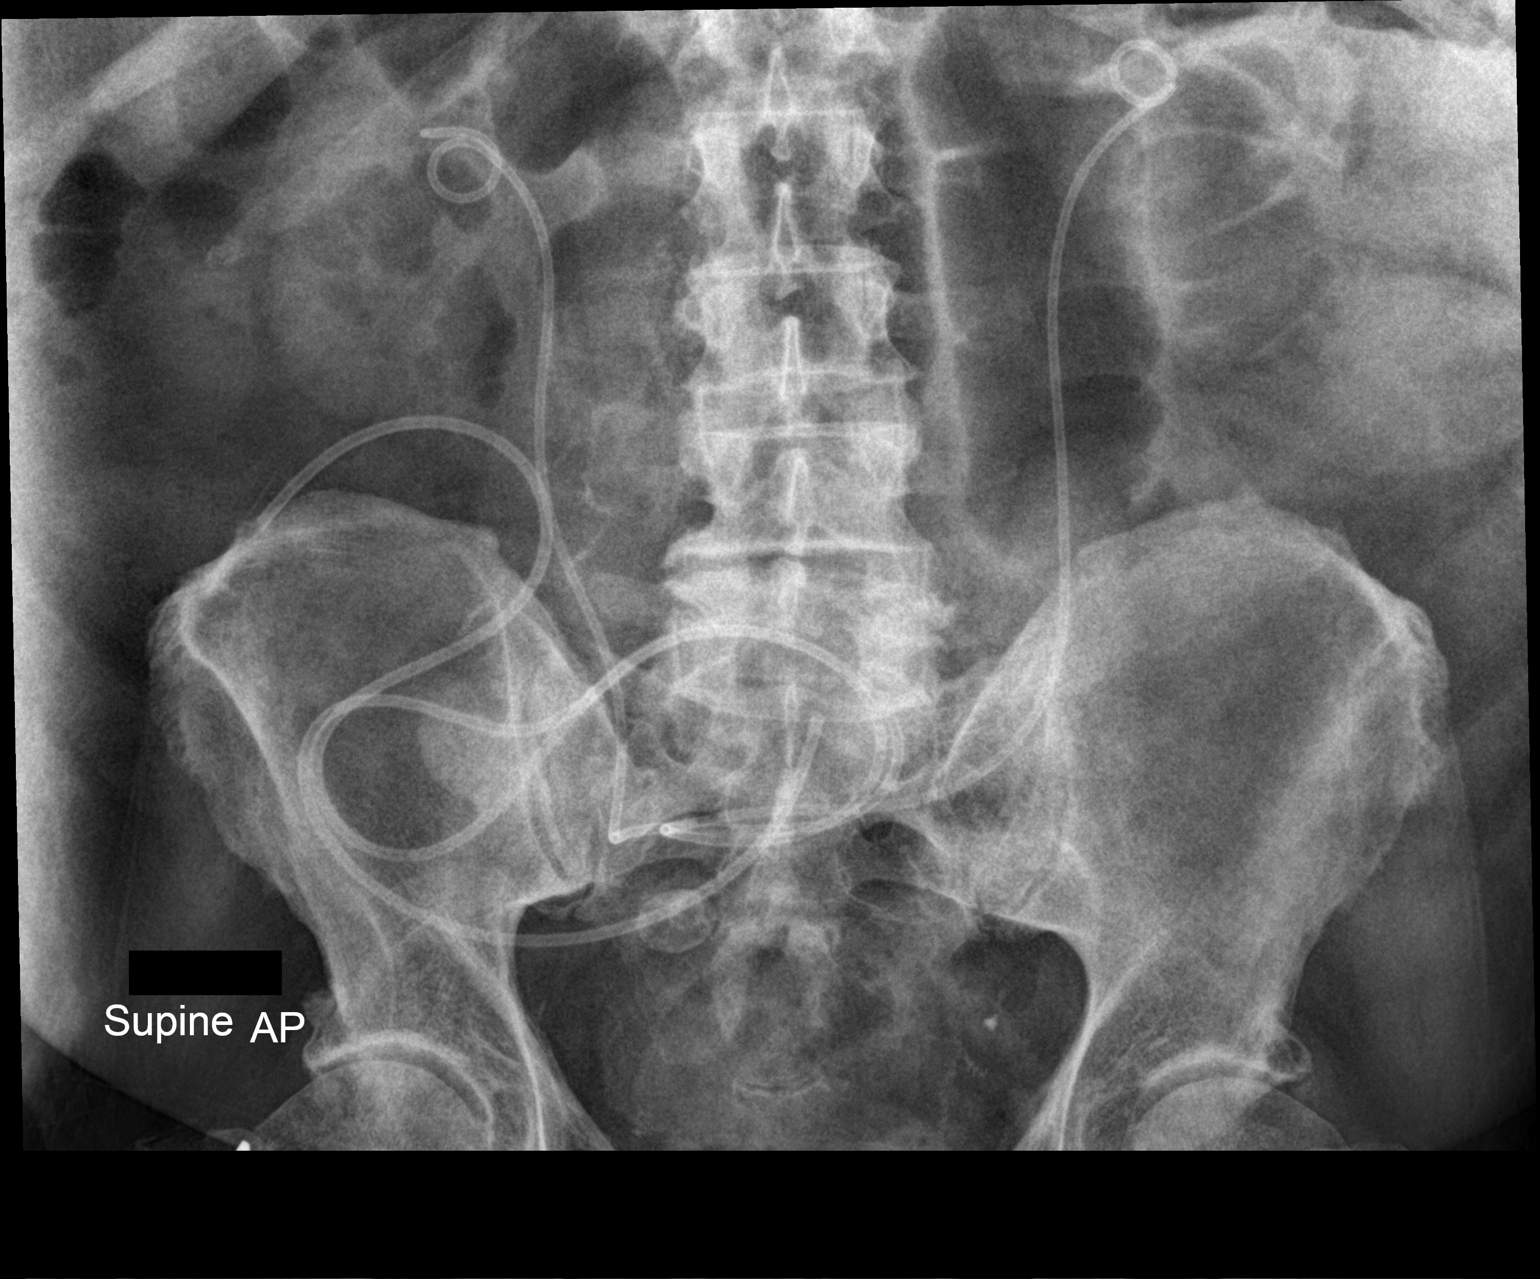

[2 of 2 positions shown; findings below may reference images not displayed]

FINDINGS: The nasogastric tube tip projects in the region of the distal
stomach. The stomach is nondistended. There are loops of mildly
distended gas-filled small bowel in the mid abdomen. The colonic
distention demonstrated yesterday has resolved. There is a small
amount of gas and stool in the rectum. There are double pigtail
stents present. The stent on the right appears to terminate over the
mid sacrum. The stent on the left terminates above the right iliac
crest.
IMPRESSION: 1. Interval improvement in the gaseous distention of the colon.
There remains mild small bowel distention. No free extraluminal gas
collections are demonstrated.
2. Bilateral single pigtail stents revealing evidence of the
previous urinary diversion. The distal tips of the stents appear to
be mobile.

## 2018-04-18 DEATH — deceased
